# Patient Record
Sex: Male | Born: 1935 | Race: White | Hispanic: No | Marital: Married | Smoking: Former smoker
Health system: Southern US, Community
[De-identification: ages and names within clinical notes are randomized; demographics above are authoritative.]

## PROBLEM LIST (undated history)

## (undated) DIAGNOSIS — D649 Anemia, unspecified: Secondary | ICD-10-CM

## (undated) DIAGNOSIS — I472 Ventricular tachycardia, unspecified: Secondary | ICD-10-CM

## (undated) DIAGNOSIS — I059 Rheumatic mitral valve disease, unspecified: Secondary | ICD-10-CM

## (undated) DIAGNOSIS — K859 Acute pancreatitis without necrosis or infection, unspecified: Secondary | ICD-10-CM

## (undated) DIAGNOSIS — I10 Essential (primary) hypertension: Secondary | ICD-10-CM

## (undated) DIAGNOSIS — I5022 Chronic systolic (congestive) heart failure: Secondary | ICD-10-CM

## (undated) DIAGNOSIS — I2510 Atherosclerotic heart disease of native coronary artery without angina pectoris: Secondary | ICD-10-CM

## (undated) DIAGNOSIS — I252 Old myocardial infarction: Secondary | ICD-10-CM

## (undated) DIAGNOSIS — K219 Gastro-esophageal reflux disease without esophagitis: Secondary | ICD-10-CM

## (undated) DIAGNOSIS — E785 Hyperlipidemia, unspecified: Secondary | ICD-10-CM

## (undated) DIAGNOSIS — I4891 Unspecified atrial fibrillation: Secondary | ICD-10-CM

## (undated) DIAGNOSIS — K649 Unspecified hemorrhoids: Secondary | ICD-10-CM

## (undated) HISTORY — PX: CARDIAC CATHETERIZATION: SHX172

---

## 2008-10-21 ENCOUNTER — Inpatient Hospital Stay: Payer: Self-pay | Admitting: Internal Medicine

## 2009-04-12 ENCOUNTER — Inpatient Hospital Stay: Payer: Self-pay | Admitting: Internal Medicine

## 2010-06-12 ENCOUNTER — Observation Stay: Payer: Self-pay | Admitting: Internal Medicine

## 2011-11-22 ENCOUNTER — Ambulatory Visit: Payer: Self-pay | Admitting: Surgery

## 2012-10-27 ENCOUNTER — Emergency Department: Payer: Self-pay | Admitting: Emergency Medicine

## 2012-10-27 LAB — URINALYSIS, COMPLETE
Bilirubin,UR: NEGATIVE
Blood: NEGATIVE
Ketone: NEGATIVE
Nitrite: NEGATIVE
Ph: 6 (ref 4.5–8.0)
Protein: NEGATIVE
RBC,UR: 1 /HPF (ref 0–5)
Specific Gravity: 1.025 (ref 1.003–1.030)
WBC UR: <1 /HPF (ref 0–5)

## 2012-10-27 LAB — COMPREHENSIVE METABOLIC PANEL
Albumin: 3.4 g/dL (ref 3.4–5.0)
Alkaline Phosphatase: 81 U/L (ref 50–136)
Anion Gap: 8 (ref 7–16)
BUN: 20 mg/dL — ABNORMAL HIGH (ref 7–18)
Calcium, Total: 8.4 mg/dL — ABNORMAL LOW (ref 8.5–10.1)
Chloride: 106 mmol/L (ref 98–107)
SGOT(AST): 21 U/L (ref 15–37)
SGPT (ALT): 23 U/L (ref 12–78)
Total Protein: 6.6 g/dL (ref 6.4–8.2)

## 2012-10-27 LAB — CBC
HCT: 40 % (ref 40.0–52.0)
HGB: 13.4 g/dL (ref 13.0–18.0)
MCV: 99 fL (ref 80–100)
RBC: 4.02 10*6/uL — ABNORMAL LOW (ref 4.40–5.90)
RDW: 12.9 % (ref 11.5–14.5)

## 2012-10-27 LAB — TROPONIN I: Troponin-I: <0.02 ng/mL

## 2012-10-27 LAB — CK TOTAL AND CKMB (NOT AT ARMC)
CK, Total: 76 U/L (ref 35–232)
CK-MB: 1.4 ng/mL (ref 0.5–3.6)

## 2013-11-18 ENCOUNTER — Inpatient Hospital Stay: Payer: Self-pay | Admitting: Internal Medicine

## 2013-11-18 LAB — BASIC METABOLIC PANEL
Anion Gap: 7 (ref 7–16)
BUN: 26 mg/dL — ABNORMAL HIGH (ref 7–18)
CALCIUM: 8.8 mg/dL (ref 8.5–10.1)
CHLORIDE: 104 mmol/L (ref 98–107)
Co2: 28 mmol/L (ref 21–32)
Creatinine: 1.38 mg/dL — ABNORMAL HIGH (ref 0.60–1.30)
EGFR (African American): 56 — ABNORMAL LOW
EGFR (Non-African Amer.): 48 — ABNORMAL LOW
Glucose: 124 mg/dL — ABNORMAL HIGH (ref 65–99)
Osmolality: 284 (ref 275–301)
Potassium: 3.8 mmol/L (ref 3.5–5.1)
Sodium: 139 mmol/L (ref 136–145)

## 2013-11-18 LAB — PRO B NATRIURETIC PEPTIDE: B-Type Natriuretic Peptide: 572 pg/mL — ABNORMAL HIGH (ref 0–450)

## 2013-11-18 LAB — CBC
HCT: 39.3 % — AB (ref 40.0–52.0)
HGB: 13.4 g/dL (ref 13.0–18.0)
MCH: 34 pg (ref 26.0–34.0)
MCHC: 34.1 g/dL (ref 32.0–36.0)
MCV: 100 fL (ref 80–100)
Platelet: 176 10*3/uL (ref 150–440)
RBC: 3.95 10*6/uL — AB (ref 4.40–5.90)
RDW: 12.9 % (ref 11.5–14.5)
WBC: 10.8 10*3/uL — ABNORMAL HIGH (ref 3.8–10.6)

## 2013-11-18 LAB — PROTIME-INR
INR: 1
PROTHROMBIN TIME: 12.8 s (ref 11.5–14.7)

## 2013-11-18 LAB — LIPASE, BLOOD: LIPASE: 3346 U/L — AB (ref 73–393)

## 2013-11-18 LAB — TROPONIN I: Troponin-I: <0.02 ng/mL

## 2013-11-19 LAB — COMPREHENSIVE METABOLIC PANEL
ALK PHOS: 76 U/L
Albumin: 2.8 g/dL — ABNORMAL LOW (ref 3.4–5.0)
Anion Gap: 4 — ABNORMAL LOW (ref 7–16)
BUN: 20 mg/dL — AB (ref 7–18)
Bilirubin,Total: 0.4 mg/dL (ref 0.2–1.0)
CO2: 27 mmol/L (ref 21–32)
Calcium, Total: 7.9 mg/dL — ABNORMAL LOW (ref 8.5–10.1)
Chloride: 108 mmol/L — ABNORMAL HIGH (ref 98–107)
Creatinine: 1.14 mg/dL (ref 0.60–1.30)
EGFR (African American): >60
EGFR (Non-African Amer.): >60
Glucose: 85 mg/dL (ref 65–99)
OSMOLALITY: 279 (ref 275–301)
Potassium: 3.9 mmol/L (ref 3.5–5.1)
SGOT(AST): 29 U/L (ref 15–37)
SGPT (ALT): 28 U/L (ref 12–78)
Sodium: 139 mmol/L (ref 136–145)
TOTAL PROTEIN: 6 g/dL — AB (ref 6.4–8.2)

## 2013-11-19 LAB — URINALYSIS, COMPLETE
BACTERIA: NONE SEEN
BILIRUBIN, UR: NEGATIVE
Blood: NEGATIVE
GLUCOSE, UR: NEGATIVE mg/dL (ref 0–75)
Ketone: NEGATIVE
LEUKOCYTE ESTERASE: NEGATIVE
Nitrite: NEGATIVE
PH: 6 (ref 4.5–8.0)
PROTEIN: NEGATIVE
RBC,UR: 1 /HPF (ref 0–5)
SPECIFIC GRAVITY: 1.045 (ref 1.003–1.030)
Squamous Epithelial: NONE SEEN

## 2013-11-19 LAB — LIPID PANEL
Cholesterol: 113 mg/dL (ref 0–200)
HDL: 41 mg/dL (ref 40–60)
Ldl Cholesterol, Calc: 49 mg/dL (ref 0–100)
Triglycerides: 117 mg/dL (ref 0–200)
VLDL CHOLESTEROL, CALC: 23 mg/dL (ref 5–40)

## 2013-11-19 LAB — MAGNESIUM: Magnesium: 1.7 mg/dL — ABNORMAL LOW

## 2013-11-19 LAB — CBC WITH DIFFERENTIAL/PLATELET
BASOS ABS: 0.1 10*3/uL (ref 0.0–0.1)
Basophil %: 1 %
EOS ABS: 0.1 10*3/uL (ref 0.0–0.7)
Eosinophil %: 1.6 %
HCT: 34.8 % — AB (ref 40.0–52.0)
HGB: 11.9 g/dL — AB (ref 13.0–18.0)
LYMPHS ABS: 0.7 10*3/uL — AB (ref 1.0–3.6)
Lymphocyte %: 12.3 %
MCH: 34.3 pg — ABNORMAL HIGH (ref 26.0–34.0)
MCHC: 34.2 g/dL (ref 32.0–36.0)
MCV: 100 fL (ref 80–100)
MONOS PCT: 11.8 %
Monocyte #: 0.7 x10 3/mm (ref 0.2–1.0)
NEUTROS ABS: 4.1 10*3/uL (ref 1.4–6.5)
Neutrophil %: 73.3 %
PLATELETS: 160 10*3/uL (ref 150–440)
RBC: 3.47 10*6/uL — ABNORMAL LOW (ref 4.40–5.90)
RDW: 13 % (ref 11.5–14.5)
WBC: 5.6 10*3/uL (ref 3.8–10.6)

## 2013-11-19 LAB — CK-MB
CK-MB: 2.1 ng/mL (ref 0.5–3.6)
CK-MB: 1.9 ng/mL (ref 0.5–3.6)
CK-MB: 1.7 ng/mL (ref 0.5–3.6)

## 2013-11-19 LAB — TROPONIN I
Troponin-I: <0.02 ng/mL
Troponin-I: <0.02 ng/mL

## 2013-11-19 LAB — LIPASE, BLOOD: Lipase: 1160 U/L — ABNORMAL HIGH (ref 73–393)

## 2013-11-20 LAB — BASIC METABOLIC PANEL
Anion Gap: 5 — ABNORMAL LOW (ref 7–16)
BUN: 13 mg/dL (ref 7–18)
CHLORIDE: 107 mmol/L (ref 98–107)
CO2: 27 mmol/L (ref 21–32)
CREATININE: 1.19 mg/dL (ref 0.60–1.30)
Calcium, Total: 8.6 mg/dL (ref 8.5–10.1)
EGFR (Non-African Amer.): 57 — ABNORMAL LOW
GLUCOSE: 89 mg/dL (ref 65–99)
Osmolality: 277 (ref 275–301)
Potassium: 4.2 mmol/L (ref 3.5–5.1)
Sodium: 139 mmol/L (ref 136–145)

## 2013-11-20 LAB — CBC WITH DIFFERENTIAL/PLATELET
BASOS ABS: 0.1 10*3/uL (ref 0.0–0.1)
Basophil %: 0.8 %
EOS ABS: 0.2 10*3/uL (ref 0.0–0.7)
EOS PCT: 2.1 %
HCT: 40.3 % (ref 40.0–52.0)
HGB: 13.7 g/dL (ref 13.0–18.0)
Lymphocyte #: 0.7 10*3/uL — ABNORMAL LOW (ref 1.0–3.6)
Lymphocyte %: 9.7 %
MCH: 34.3 pg — ABNORMAL HIGH (ref 26.0–34.0)
MCHC: 34 g/dL (ref 32.0–36.0)
MCV: 101 fL — AB (ref 80–100)
MONO ABS: 0.8 x10 3/mm (ref 0.2–1.0)
Monocyte %: 10.1 %
NEUTROS ABS: 5.9 10*3/uL (ref 1.4–6.5)
Neutrophil %: 77.3 %
Platelet: 170 10*3/uL (ref 150–440)
RBC: 4 10*6/uL — AB (ref 4.40–5.90)
RDW: 13.3 % (ref 11.5–14.5)
WBC: 7.6 10*3/uL (ref 3.8–10.6)

## 2013-11-20 LAB — LIPASE, BLOOD: Lipase: 649 U/L — ABNORMAL HIGH (ref 73–393)

## 2013-12-15 ENCOUNTER — Emergency Department: Payer: Self-pay | Admitting: Emergency Medicine

## 2013-12-15 LAB — CBC
HCT: 41.6 % (ref 40.0–52.0)
HGB: 13.6 g/dL (ref 13.0–18.0)
MCH: 33.1 pg (ref 26.0–34.0)
MCHC: 32.7 g/dL (ref 32.0–36.0)
MCV: 101 fL — ABNORMAL HIGH (ref 80–100)
Platelet: 180 10*3/uL (ref 150–440)
RBC: 4.12 10*6/uL — ABNORMAL LOW (ref 4.40–5.90)
RDW: 12.9 % (ref 11.5–14.5)
WBC: 6.9 10*3/uL (ref 3.8–10.6)

## 2013-12-15 LAB — BASIC METABOLIC PANEL
Anion Gap: 2 — ABNORMAL LOW (ref 7–16)
BUN: 22 mg/dL — ABNORMAL HIGH (ref 7–18)
CHLORIDE: 103 mmol/L (ref 98–107)
Calcium, Total: 8.6 mg/dL (ref 8.5–10.1)
Co2: 31 mmol/L (ref 21–32)
Creatinine: 1.35 mg/dL — ABNORMAL HIGH (ref 0.60–1.30)
GFR CALC AF AMER: 57 — AB
GFR CALC NON AF AMER: 49 — AB
Glucose: 90 mg/dL (ref 65–99)
OSMOLALITY: 275 (ref 275–301)
POTASSIUM: 4.1 mmol/L (ref 3.5–5.1)
SODIUM: 136 mmol/L (ref 136–145)

## 2013-12-15 LAB — TROPONIN I: Troponin-I: <0.02 ng/mL

## 2013-12-15 LAB — LIPASE, BLOOD: LIPASE: 1239 U/L — AB (ref 73–393)

## 2014-03-26 ENCOUNTER — Inpatient Hospital Stay: Payer: Self-pay | Admitting: Internal Medicine

## 2014-03-26 LAB — COMPREHENSIVE METABOLIC PANEL
Albumin: 3.6 g/dL (ref 3.4–5.0)
Alkaline Phosphatase: 74 U/L
Anion Gap: 11 (ref 7–16)
BILIRUBIN TOTAL: 0.7 mg/dL (ref 0.2–1.0)
BUN: 28 mg/dL — ABNORMAL HIGH (ref 7–18)
Calcium, Total: 9 mg/dL (ref 8.5–10.1)
Chloride: 104 mmol/L (ref 98–107)
Co2: 25 mmol/L (ref 21–32)
Creatinine: 1.3 mg/dL (ref 0.60–1.30)
GFR CALC AF AMER: 59 — AB
GFR CALC NON AF AMER: 51 — AB
Glucose: 161 mg/dL — ABNORMAL HIGH (ref 65–99)
Osmolality: 288 (ref 275–301)
Potassium: 4.4 mmol/L (ref 3.5–5.1)
SGOT(AST): 28 U/L (ref 15–37)
SGPT (ALT): 25 U/L (ref 12–78)
Sodium: 140 mmol/L (ref 136–145)
Total Protein: 7.4 g/dL (ref 6.4–8.2)

## 2014-03-26 LAB — LIPASE, BLOOD: LIPASE: 951 U/L — AB (ref 73–393)

## 2014-03-26 LAB — CBC
HCT: 40.8 % (ref 40.0–52.0)
HGB: 13.5 g/dL (ref 13.0–18.0)
MCH: 33.9 pg (ref 26.0–34.0)
MCHC: 33.1 g/dL (ref 32.0–36.0)
MCV: 102 fL — ABNORMAL HIGH (ref 80–100)
PLATELETS: 200 10*3/uL (ref 150–440)
RBC: 3.99 10*6/uL — ABNORMAL LOW (ref 4.40–5.90)
RDW: 12.7 % (ref 11.5–14.5)
WBC: 11.5 10*3/uL — AB (ref 3.8–10.6)

## 2014-03-26 LAB — TROPONIN I
Troponin-I: <0.02 ng/mL
Troponin-I: <0.02 ng/mL

## 2014-03-27 LAB — CBC WITH DIFFERENTIAL/PLATELET
BASOS PCT: 0.7 %
Basophil #: 0 10*3/uL (ref 0.0–0.1)
Eosinophil #: 0.1 10*3/uL (ref 0.0–0.7)
Eosinophil %: 2 %
HCT: 36.8 % — ABNORMAL LOW (ref 40.0–52.0)
HGB: 12.3 g/dL — ABNORMAL LOW (ref 13.0–18.0)
LYMPHS ABS: 1.2 10*3/uL (ref 1.0–3.6)
LYMPHS PCT: 17.3 %
MCH: 34.3 pg — ABNORMAL HIGH (ref 26.0–34.0)
MCHC: 33.5 g/dL (ref 32.0–36.0)
MCV: 102 fL — ABNORMAL HIGH (ref 80–100)
MONO ABS: 0.8 x10 3/mm (ref 0.2–1.0)
Monocyte %: 11.7 %
Neutrophil #: 4.7 10*3/uL (ref 1.4–6.5)
Neutrophil %: 68.3 %
PLATELETS: 163 10*3/uL (ref 150–440)
RBC: 3.6 10*6/uL — ABNORMAL LOW (ref 4.40–5.90)
RDW: 13 % (ref 11.5–14.5)
WBC: 6.9 10*3/uL (ref 3.8–10.6)

## 2014-03-27 LAB — URINALYSIS, COMPLETE
BILIRUBIN, UR: NEGATIVE
BLOOD: NEGATIVE
Glucose,UR: NEGATIVE mg/dL (ref 0–75)
KETONE: NEGATIVE
LEUKOCYTE ESTERASE: NEGATIVE
NITRITE: NEGATIVE
PROTEIN: NEGATIVE
Ph: 8 (ref 4.5–8.0)
RBC,UR: NONE SEEN /HPF (ref 0–5)
SQUAMOUS EPITHELIAL: NONE SEEN
Specific Gravity: 1.009 (ref 1.003–1.030)
WBC UR: 1 /HPF (ref 0–5)

## 2014-03-27 LAB — BASIC METABOLIC PANEL
Anion Gap: 7 (ref 7–16)
BUN: 20 mg/dL — AB (ref 7–18)
CREATININE: 1.16 mg/dL (ref 0.60–1.30)
Calcium, Total: 8.2 mg/dL — ABNORMAL LOW (ref 8.5–10.1)
Chloride: 107 mmol/L (ref 98–107)
Co2: 29 mmol/L (ref 21–32)
EGFR (African American): >60
EGFR (Non-African Amer.): 59 — ABNORMAL LOW
GLUCOSE: 94 mg/dL (ref 65–99)
Osmolality: 287 (ref 275–301)
Potassium: 4.1 mmol/L (ref 3.5–5.1)
SODIUM: 143 mmol/L (ref 136–145)

## 2014-04-23 ENCOUNTER — Ambulatory Visit: Payer: Self-pay | Admitting: Internal Medicine

## 2014-07-08 ENCOUNTER — Ambulatory Visit: Payer: Self-pay | Admitting: Internal Medicine

## 2014-07-09 ENCOUNTER — Ambulatory Visit: Payer: Self-pay | Admitting: Surgery

## 2014-07-16 ENCOUNTER — Ambulatory Visit: Payer: Self-pay | Admitting: Surgery

## 2014-08-10 ENCOUNTER — Emergency Department: Payer: Self-pay | Admitting: Emergency Medicine

## 2014-08-10 LAB — CBC WITH DIFFERENTIAL/PLATELET
Basophil #: 0.1 10*3/uL (ref 0.0–0.1)
Basophil %: 1.6 %
EOS ABS: 0.2 10*3/uL (ref 0.0–0.7)
Eosinophil %: 2.1 %
HCT: 41.8 % (ref 40.0–52.0)
HGB: 13.8 g/dL (ref 13.0–18.0)
Lymphocyte #: 1.2 10*3/uL (ref 1.0–3.6)
Lymphocyte %: 15.1 %
MCH: 33.1 pg (ref 26.0–34.0)
MCHC: 32.9 g/dL (ref 32.0–36.0)
MCV: 101 fL — AB (ref 80–100)
Monocyte #: 0.6 x10 3/mm (ref 0.2–1.0)
Monocyte %: 7.4 %
NEUTROS ABS: 6.1 10*3/uL (ref 1.4–6.5)
Neutrophil %: 73.8 %
Platelet: 184 10*3/uL (ref 150–440)
RBC: 4.16 10*6/uL — AB (ref 4.40–5.90)
RDW: 12.6 % (ref 11.5–14.5)
WBC: 8.2 10*3/uL (ref 3.8–10.6)

## 2014-08-10 LAB — COMPREHENSIVE METABOLIC PANEL
Albumin: 3.3 g/dL — ABNORMAL LOW (ref 3.4–5.0)
Alkaline Phosphatase: 75 U/L
Anion Gap: 7 (ref 7–16)
BUN: 20 mg/dL — AB (ref 7–18)
Bilirubin,Total: 0.5 mg/dL (ref 0.2–1.0)
CALCIUM: 8.5 mg/dL (ref 8.5–10.1)
Chloride: 108 mmol/L — ABNORMAL HIGH (ref 98–107)
Co2: 28 mmol/L (ref 21–32)
Creatinine: 1.17 mg/dL (ref 0.60–1.30)
EGFR (Non-African Amer.): >60
Glucose: 130 mg/dL — ABNORMAL HIGH (ref 65–99)
Osmolality: 289 (ref 275–301)
POTASSIUM: 4.3 mmol/L (ref 3.5–5.1)
SGOT(AST): 30 U/L (ref 15–37)
SGPT (ALT): 27 U/L
Sodium: 143 mmol/L (ref 136–145)
Total Protein: 6.9 g/dL (ref 6.4–8.2)

## 2014-08-10 LAB — LIPASE, BLOOD: Lipase: 765 U/L — ABNORMAL HIGH (ref 73–393)

## 2014-08-10 LAB — TROPONIN I: Troponin-I: <0.02 ng/mL

## 2015-01-09 NOTE — Consult Note (Signed)
Chief Complaint:  Subjective/Chief Complaint Please see full GI consult and brief consult note.  Paitent 81 yo m admitted with acute pancreatitis, epigastric pain and nausea.  feeling much better currently.  CT concerning of dilated P-duct and possible lesion in the heqad of the pancreas. Currently no abdominalpain.  Will awaiti mrcp tomorrow, will discuss with Dr Earlie Counts possible EUS tomorrow.  Further recs to follow.   VITAL SIGNS/ANCILLARY NOTES: **Vital Signs.:   04-Mar-15 14:05  Celsius 36.8  Temperature Source oral  Pulse Pulse 66  Respirations Respirations 20  Systolic BP Systolic BP 283  Diastolic BP (mmHg) Diastolic BP (mmHg) 72  Mean BP 93  Pulse Ox % Pulse Ox % 93  Pulse Ox Activity Level  At rest  Oxygen Delivery Room Air/ 21 %   Brief Assessment:  Cardiac Regular   Respiratory clear BS   Gastrointestinal details normal Soft  Nontender  Nondistended  No masses palpable  Bowel sounds normal   Lab Results: Hepatic:  04-Mar-15 06:08   Bilirubin, Total 0.4  Alkaline Phosphatase 76 (45-117 NOTE: New Reference Range 08/08/13)  SGPT (ALT) 28  SGOT (AST) 29  Total Protein, Serum  6.0  Albumin, Serum  2.8  Routine Chem:  03-Mar-15 20:23   Lipase  3346 (Result(s) reported on 18 Nov 2013 at 09:14PM.)  04-Mar-15 06:08   Cholesterol, Serum 113  Triglycerides, Serum 117  HDL (INHOUSE) 41  VLDL Cholesterol Calculated 23  LDL Cholesterol Calculated 49 (Result(s) reported on 19 Nov 2013 at 04:02PM.)  Lipase  1160 (Result(s) reported on 19 Nov 2013 at 09:16AM.)  Glucose, Serum 85  BUN  20  Creatinine (comp) 1.14  Sodium, Serum 139  Potassium, Serum 3.9  Chloride, Serum  108  CO2, Serum 27  Calcium (Total), Serum  7.9  Osmolality (calc) 279  eGFR (African American) >60  eGFR (Non-African American) >60 (eGFR values <21m/min/1.73 m2 may be an indication of chronic kidney disease (CKD). Calculated eGFR is useful in patients with stable renal function. The eGFR  calculation will not be reliable in acutely ill patients when serum creatinine is changing rapidly. It is not useful in  patients on dialysis. The eGFR calculation may not be applicable to patients at the low and high extremes of body sizes, pregnant women, and vegetarians.)  Anion Gap  4  Magnesium, Serum  1.7 (1.8-2.4 THERAPEUTIC RANGE: 4-7 mg/dL TOXIC: > 10 mg/dL  -----------------------)  Cardiac:  04-Mar-15 02:06   CPK-MB, Serum 1.9 (Result(s) reported on 19 Nov 2013 at 02:58AM.)  Troponin I < 0.02 (0.00-0.05 0.05 ng/mL or less: NEGATIVE  Repeat testing in 3-6 hrs  if clinically indicated. >0.05 ng/mL: POTENTIAL  MYOCARDIAL INJURY. Repeat  testing in 3-6 hrs if  clinically indicated. NOTE: An increase or decrease  of 30% or more on serial  testing suggests a  clinically important change)    06:08   CPK-MB, Serum 1.7 (Result(s) reported on 19 Nov 2013 at 06:41AM.)  Troponin I < 0.02 (0.00-0.05 0.05 ng/mL or less: NEGATIVE  Repeat testing in 3-6 hrs  if clinically indicated. >0.05 ng/mL: POTENTIAL  MYOCARDIAL INJURY. Repeat  testing in 3-6 hrs if  clinically indicated. NOTE: An increase or decrease  of 30% or more on serial  testing suggests a  clinically important change)  Routine Hem:  04-Mar-15 06:08   WBC (CBC) 5.6  RBC (CBC)  3.47  Hemoglobin (CBC)  11.9  Hematocrit (CBC)  34.8  Platelet Count (CBC) 160  MCV 100  MCH  34.3  MCHC 34.2  RDW 13.0  Neutrophil % 73.3  Lymphocyte % 12.3  Monocyte % 11.8  Eosinophil % 1.6  Basophil % 1.0  Neutrophil # 4.1  Lymphocyte #  0.7  Monocyte # 0.7  Eosinophil # 0.1  Basophil # 0.1 (Result(s) reported on 19 Nov 2013 at 06:38AM.)   Radiology Results: CT:    03-Mar-15 22:34, CT Abdomen and Pelvis With Contrast  CT Abdomen and Pelvis With Contrast   REASON FOR EXAM:    (1) epigastric pain, tenderness; (2) epigastric pain,   tenderness  COMMENTS:   May transport without cardiac monitor    PROCEDURE: CT  - CT  ABDOMEN / PELVIS  W  - Nov 18 2013 10:34PM     CLINICAL DATA:  Chest pain.  Epigastric pain and tenderness.    EXAM:  CT ABDOMEN AND PELVIS WITH CONTRAST    TECHNIQUE:  Multidetector CT imaging of the abdomen and pelvis was performed  using the standard protocol following bolus administration of  intravenous contrast.  CONTRAST:  100 cc Isovue 370.    COMPARISON:  None.    FINDINGS:  Scarring/atelectasis lung bases.    Gas is secretion filled stomach without point of obstruction  identified.    Prominent sigmoid and descending colon diverticula without extra  luminal bowel inflammatory process, free fluid or free air.    Prominent pancreatic duct. No obstructing mass identified. In the  pancreatic head/ uncinate process region there is a 1.1 cm  low-density structure. This may be an incidental finding. Low grade  intra papillary mucinous tumor not excluded. Stability can be  confirmed on follow-up in 6 months.    1.1 cm calcified splenic artery aneurysm.    Prominent coronary artery calcifications.  Heart size top-normal.    Atherosclerotic type changes of the aorta with mild ectasia without  aneurysmal dilation. Calcified plaque common iliac arteries with  mild to moderate narrowing.    Mild intrahepatic biliary duct prominence felt to be within range of  normal limits given the patient's age.  Radiopaque gravel within the dependent portion of the gallbladder  without CT evidence of inflammation. If this is of concern  ultrasound recommended for further delineation.    No worrisome splenic, renal or left adrenal lesion. Adenoma right  adrenal gland suspected. Parapelvic renal cysts larger on the left.    Degenerative changes lumbar spine and hip joints. Spinal stenosis  most prominent mid lumbar region.    Non contrast filled views of the urinary bladder unremarkable.  Slightly lobulated contour of the prostate gland. Clinical and  laboratory correlation  recommended.    Fatty containing left inguinal hernia.   IMPRESSION:  Gas is secretion filled stomach without point of obstruction  identified.    Prominent sigmoid and descending colon diverticula without extra  luminal bowel inflammatory process, free fluid or free air.    Prominent pancreatic duct. No obstructing mass identified. In the  pancreatic head/ uncinate process region there is a 1.1 cm  low-density structure. This may be an incidental finding.Low grade  intra papillary mucinous tumor not excluded. Stability can be  confirmed on follow-up in 6 months.    1.1 cm calcified splenic artery aneurysm.  Prominent coronary artery calcifications.    Atherosclerotic type changes of the aorta and common iliac arteries.    Mild intrahepatic biliary duct prominence felt to be within range of  normal limits given the patient's age.    Radiopaque gravel within the dependent  portion of the gallbladder  without CT evidence of inflammation. If this is of concern  ultrasound recommended for further delineation.    Adenoma right adrenal gland suspected.    Slightly lobulated contour of the prostate gland. Clinical and  laboratory correlation recommended.  Electronically Signed    By: Chauncey Cruel M.D.    On: 11/18/2013 23:03         Verified By: Doug Sou, M.D.,   Electronic Signatures: Loistine Simas (MD)  (Signed 04-Mar-15 16:29)  Authored: Chief Complaint, VITAL SIGNS/ANCILLARY NOTES, Brief Assessment, Lab Results, Radiology Results   Last Updated: 04-Mar-15 16:29 by Loistine Simas (MD)

## 2015-01-09 NOTE — H&P (Signed)
PATIENT NAME:  [name], Shiraz L MR#:  [id number] DATE OF BIRTH:  1930-12-27  DATE OF ADMISSION:  03/26/2014  PRIMARY DOCTOR:  Dr. Marcello FennelHande  EMERGENCY ROOM PHYSICIAN:  Dr. Margarita GrizzleWoodruff.   CHIEF COMPLAINT: Abdominal pain and chest pain.   HISTORY OF PRESENT ILLNESS:  An 81 year old male patient, discharged from hospital in March for acute pancreatitis.  Comes back today because of abdominal pain and chest pain. The patient was alone in his yard and became diaphoretic and weak.  At that time, his blood pressure was 78/48. The patient was very diaphoretic and also the patient blood pressure was low.  Patient complains of epigastric pain since morning to around 1:30 p.m. today. Denies any nausea and vomiting, and the patient thought it was gas pain and took some Mylanta. The patient denies any diarrhea and denies any chest pain or trouble breathing. The patient was seen and admitted in March.  At that time, he was here from March 3 to March 5.  He had a lot of tests for pancreatitis. The patient had MRCP and patient was seen by Dr. Marva PandaSkulskie.  Patient's MRCP showed chronic pancreatitis, and the patient had a questionable cystic focus, which is 1 cm within the pancreatic head and uncinate process junction. The patient did not have tumor marker at that time because of acute pancreatitis. The patient is supposed to have an endoscopic ultrasound.  That is the only test that is pending at this time to evaluate for pancreatitis. The patient saw Dr. Marva PandaSkulskie yesterday. According to him, everything was normal, and the patient is scheduled to follow with MRCP in September.  In the ER, the Dr. Marva PandaSkulskie was contacted by Dr. Margarita GrizzleWoodruff and he suggested medical management and admission to medicine.  The patient's family is requesting further testing in terms of EUS or some other testing for his pancreatitis. The patient's other labs are non-significant. Troponins were also negative at this time.   PAST MEDICAL HISTORY: Significant  for history of history of fibrillation and also history of coronary artery disease.  When he was admitted in March, his lipase was 3246. The patient also had a history of ventricular tachycardia and he was on amiodarone.   ALLERGIES: NO KNOWN ALLERGIES.   MEDICATIONS:  1.  Imdur 30 mg p.o. daily. 2.  Aspirin 81 mg daily. 3.  Lisinopril 10 mg p.o. daily. 4.  (  5.  According to him, he is not taking amiodarone and Dr. Gwen PoundsKowalski stopped it 2 months ago.   FAMILY HISTORY: No family history of coronary artery disease.   SOCIAL HISTORY: No smoking, no drinking, previous smoker.   PAST SURGICAL HISTORY: Significant for knee repair.     REVIEW OF SYSTEMS: CONSTITUTIONAL: He feels good.  He wants to go home. Denies any fever or fatigue.  EYES: No blurred vision.  EARS, NOSE, AND THROAT: No tinnitus. No epistaxis. No difficulty swallowing.  RESPIRATIONS: No cough. No wheezing.  CARDIOVASCULAR: No chest pain, orthopnea, or PND. GASTROINTESTINAL:  Mild epigastric pain present. No nausea. No vomiting. No other problems. No constipation.  GENITOURINARY: No dysuria.  ENDOCRINE: No polyuria or nocturia.  HEMATOLOGIC: No anemia.  INTEGUMENTARY: No skin rashes.  MUSCULOSKELETAL: No joint pain. NEUROLOGIC: No numbness or weakness.  PSYCHOLOGIC:  No injection or insomnia.   PHYSICAL EXAMINATION: GENERAL:   This is an 81 year old male, well-developed, well-nourished, not in distress at this time. VITAL SIGNS:  Temperature is 97.5, heart rate 75, blood pressure 128/98, saturations 100% on room air.  GENERAL: The patient was hypotensive at home, blood pressure was 78/48.  EYES: PERRLA.  EOM intact. No scleral icterus. HEAD:  Normocephalic, atraumatic.   NOSE: No lesions.  No drainage. EARS: No drainage. No lesions.  MOUTH: No lesions. No exudates.  NECK: Supple. No JVD. No carotid bruit.  No quickly range of motion.  RESPIRATORY: Good respiratory effort. Clear to auscultation. No wheeze or rales.   CARDIOVASCULAR: S1, S2 regular. No murmurs. No gallops.  PMI not displaced. GASTROINTESTINAL: Mild epigastric pain present.  No rebound tenderness.  No hernias. Bowel sounds are present in all quadrants.  MUSCULOSKELETAL: Normal gait and station.  EXTREMITIES: Moves x 4. No tenderness ,effusion.  SKIN: Inspection is normal.  LYMPHATICS: No lymphadenopathy.   VASCULAR: Good pedal pulses.  NEUROLOGIC: Alert and oriented.  Cranial nerves II-XII intact.  Power 5/5 upper and lower extremities are intact.  DTRs are intact bilaterally.   The patient's chest x-ray is> without any acute cardiopulmonary abnormality.    WBC 7.3, hemoglobin 13.5, hematocrit 30.8, platelets 200, lipase 950.   ELECTROLYTES: Sodium 140, potassium 4.4, chloride 104, bicarbonate 25, BUN 28, creatinine 1.3 and serum glucose 151.   Patient's troponin less than 0.02.   EKG: Normal sinus rhythm with some PVCs at 62 beats per minutes. No ST-T changes.   ASSESSMENT AND PLAN:  1.  The patient is an 81 year old male patient with sudden onset of dizziness and hypotension probably secondary to dehydration other than any coronary event.  EKG and troponins are negative, but continue to cycle troponins and watch him closely.   2.  Mild acute pancreatitis. The patient has acute on chronic pancreatitis at this time.  Had a lot of testing done  including MRCP.  Again, he was admitted in March. We will not do further testing. Check lipase and continue fluids and also obtain GI consult with Dr. Marva Panda to evaluate further need for endoscopic ultrasound at this time.   3.  Chronic atrial fibrillation, not on anticoagulation.  He was on amiodarone drip, but that was stopped by Dr. Gwen Pounds. The patient not taking that.  4.  History of coronary artery disease.  No chest pain at this time. Continue Imdur and lisinopril and monitor closely.  5.  Gastrointestinal prophylaxis, proton pump inhibitors.   TIME SPENT ON HISTORY AND PHYSICAL: 60,  minutes.   We will transfer to Dr. Marcello Fennel.    ____________________________ Katha Hamming, MD sk:ts D: 03/26/2014 16:06:23 ET T: 03/26/2014 17:22:52 ET JOB#: [id number]  cc: Katha Hamming, MD, <Dictator> Katha Hamming MD ELECTRONICALLY SIGNED 04/02/2014 20:14

## 2015-01-09 NOTE — Consult Note (Signed)
PATIENT NAME:  [name], [name] DATE OF BIRTH:  [birth year]  DATE OF ADMISSION: 11/18/2013 DATE OF CONSULTATION:  11/19/2013  REFERRING PHYSICIAN:  Dr. Randol KernElgergawy.  CONSULTING PHYSICIAN:  Keturah Barrehristiane H. London, NP  REASON FOR CONSULTATION: To evaluate for acute pancreatitis with pancreatic growth.   HISTORY OF PRESENT ILLNESS: I appreciate consult for an 81 year old Caucasian man admitted with pancreatitis for evaluation of same and pancreatic head mass. States no history of prior pancreatitis, no new medications but did start probiotics recently. Has been on pravastatin for hyperlipidemia. Denies alcohol intake and family history of pancreatic disease. No apparent biliary disease. No history of diabetes mellitus. States onset of significant epigastric pain and burping approximately 2 hours after eating a peanut butter and banana sandwich about 36 hours ago. Thought at first he was having MI, was given GI cocktail, IV pain meds, and started on fluid resuscitation when found to have pancreatitis. He denies pain and further GI complaints presently. Lipase improved from yesterday. Do note 1.1 cm low-density lesion in the pancreatic head. IPMT is in the differential.   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   FAMILY HISTORY: No pancreatic disease, colon cancer, rectal cancer, liver disease or ulcers.   SOCIAL HISTORY: Lives at home with wife. No alcohol or tobacco. Exercises 4 times a week in the gym. Ex-smoker. No illicits.   ALLERGIES: LIPITOR.   HOME MEDICATIONS: ASA 81 mg daily, isosorbide mononitrate 15 mg daily, pravastatin 20 mg p.o. at bedtime, omeprazole 20 mg p.o. daily, vitamin E 400 units daily, lisinopril 10 mg p.o. daily, amiodarone 200 mg p.o. daily. Denies over-the-counter medications and herbals  with the exception of a probiotic he just started.   REVIEW OF SYSTEMS: Ten systems reviewed, unremarkable other than what is noted above.   MOST RECENT LABORATORIES: Glucose 85. BNP 572. BUN 20, creatinine 1.14, sodium 139, potassium 3.9, chloride 108, GFR greater than 60, calcium 7.9, Mg 1.7, lipase 1160, albumin 2.8, total protein 6, total bilirubin 0.4, ALP 76, AST 29, ALT 28. CPK-MB, troponins have been negative x3. WBC 5.6, hemoglobin 11.9, hematocrit 34.8, platelet count 160, red cells normocytic. PT-INR 12.81.   CT done yesterday with prominent pancreatic duct: There is a pancreatic head mass, 1.1 cm low-density structure. Cannot exclude interscapular mucinous tumor. Mild intrahepatic ductal prominence felt to be within range of normal limits given the patient's age. Questionable gravel in the dependent portion of the gallbladder without any signs of inflammation. Suspected right adrenal adenoma. Somewhat irregular appearance of the prostate gland.   PHYSICAL EXAMINATION:  VITAL SIGNS: Most recent: Temperature 97.9, pulse 65, respiratory rate 20, blood pressure 115/64, SaO2 94% on room air.  GENERAL: A well-appearing man resting comfortably in bed in no acute distress.  HEENT: Normocephalic, atraumatic. Sclerae are clear. No icterus.  NECK: Supple. No thyromegaly or lymphadenopathy.  CHEST: Respirations eupneic. Lungs clear.  CARDIAC: S1, S2, RRR. No MRG.  ABDOMEN: Flat, soft, nontender, nondistended. Bowel sounds x4. No guarding, rigidity, peritoneal signs, masses or other abnormalities noted.  EXTREMITIES: No clubbing. No cyanosis. Strength five out of five. Moves all extremities well.  SKIN: Warm, dry, pink. No erythema, lesion or rash.  PSYCHIATRIC: Pleasant, calm, cooperative, intact judgment and insight.  NEUROLOGICAL: Alert, oriented x3. Cranial nerves II through XII, intact. Speech clear. No facial droop.   IMPRESSION AND PLAN: Acute pancreatitis associated with pancreatic mass.  Agree with fluid resuscitation and pain control. As he is feeling better, we will advance him to clear liquid diet. Will add lipid panel to his admission blood work, schedule magnetic resonance cholangiopancreatography. As there is some pancreatic ductal dilation, likely will require endoscopic ultrasound as clinically feasible, holding tumor markers for now as current pancreatic inflammation may cause false elevation.   Regarding his prostate, defer to admitting team.   These services were provided by Vevelyn Pat, MSN, [facility], in collaboration with Barnetta Chapel, M.D.,  with whom I have discussed this patient in full.   Thank you very much for this consult.    ____________________________ Keturah Barre, NP chl:np D: 11/19/2013 15:36:48 ET T: 11/19/2013 16:13:59 ET JOB#: [id number]  cc: Keturah Barre, NP, <Dictator> Eustaquio Maize LONDON FNP ELECTRONICALLY SIGNED 12/17/2013 10:50

## 2015-01-09 NOTE — Op Note (Signed)
PATIENT NAME:  [name], [name] MR#:  [id number] DATE OF BIRTH:  11-01-1930  DATE OF PROCEDURE:  07/16/2014  PREOPERATIVE DIAGNOSIS: Recurrent left inguinal hernia.   POSTOPERATIVE DIAGNOSIS: Recurrent left inguinal hernia.   PROCEDURE: Left inguinal hernia repair.   SURGEON: Renda RollsWilton Smith, MD   ANESTHESIA: General.   INDICATIONS: This 81 year old male has had bilateral inguinal hernia repair 2 years ago and recently has had recurrent bulging in the left groin. A left inguinal hernia was demonstrated on physical examination and repair was recommended for definitive treatment.   DESCRIPTION OF PROCEDURE: The patient was placed on the operating table in the supine position under general anesthesia. The left lower quadrant was clipped and prepared with ChloraPrep, draped in a sterile manner.   A left lower quadrant transversely-oriented suprapubic incision was made at the site of an old scar and carried down through subcutaneous tissues. Several small bleeding points were cauterized. Scarpa fascia was incised. The external oblique aponeurosis was incised along the course of its fibers. There was scar tissue appreciated during the course of the dissection. There was a finding of an indirect hernia sac, which was dissected free from surrounding structures and was approximately 7 cm in length. It was separated from the cord structures. The cord structures were mobilized and retracted laterally with a Penrose drain. The sac was dissected up into the internal ring. It appeared to be a sliding type hernia with sigmoid colon. The sac was inverted. Next, the fascial ring defect was fairly thick and it appeared to be strong enough to hold sutures. The repair was carried out with a row of 0 Surgilon sutures, suturing the fascia at the edges of the ring defect and leaving a small opening for the cord structures. The repair looked good. Hemostasis was intact. Next, the cut edges of the external oblique aponeurosis  were closed with a running 4-0 Vicryl. The deep fascia superior and lateral to the repair site was infiltrated with 0.5% Sensorcaine with epinephrine. The subcutaneous tissues were infiltrated as well. The Scarpa fascia was closed with interrupted 4-0 Vicryl. The skin was closed with running 4-0 Monocryl subcuticular suture and Dermabond. The patient tolerated surgery satisfactorily and was then prepared for transfer to the recovery room.   ____________________________ Shela CommonsJ. Renda RollsWilton Smith, MD jws:TT D: 07/16/2014 08:33:33 ET T: 07/16/2014 19:20:07 ET JOB#: [id number]  cc: Adella HareJ. Wilton Smith, MD, <Dictator> Adella HareWILTON J SMITH MD ELECTRONICALLY SIGNED 07/17/2014 19:22

## 2015-01-09 NOTE — H&P (Signed)
PATIENT NAME:  [name], [name] MR#:  [id number] DATE OF BIRTH:  11-14-1930  DATE OF ADMISSION:  11/18/2013  REFERRING PHYSICIAN:  Dr. Sharman Cheek.  PRIMARY CARE PHYSICIAN:  Dr. Barbette Reichmann.   CHIEF COMPLAINT:  This is an 81 year old with significant past medical history of coronary artery disease, atrial fibrillation on aspirin who presents with complaints of epigastric pain, the patient reports at 7:00 p.m. he started to have some dull epigastric pain, was progressing with intensity.  He could not tolerate, so was nonradiating, as well had complaint by some mild nausea, reports he took some sublingual nitro at home with no relief, when it progressed in intensity he called EMS, the patient reports in EMS he did burp twice.  He was given some GI cocktail which helped with his pain, currently he denies any pain, the patient's first troponin was negative.  EKG did not show any significant changes once compared to one in [facility] in February of last year, the patient was found to have elevated lipase level at 3346, the patient had CT abdomen and pelvis with contrast which did show evidence of moderately dilated pancreatic duct without any obstructing mass, as well there was a structure of 1.1 cm was found in the pancreatic head/uncinate process, hospitalist service were requested to admit the patient for acute pancreatitis and further evaluation for his epigastric pain, the patient denies any alcohol use, any previous history of pancreatitis in the past, the patient reports he has been having problems with indigestion of the last few weeks, as well, he reports he has change in consistency of his stool where it is more pasty currently, but denies any melena, any coffee-ground emesis, any bright red blood in stools, any fever, any chills, any weight loss, any diarrhea.   PAST MEDICAL HISTORY: 1.  Coronary artery disease with known three vessel critical coronary atherosclerosis by cardiac cath  in 2010.  2.  Moderate LV dysfunction from previous MI.  3.  Hyperlipidemia.  4.  Hypertension.  5.  Ventricular tachycardia.  6.  Atrial fibrillation.   FAMILY HISTORY:  No family members with early onset of cardiovascular disease or hypertension.   SOCIAL HISTORY:  The patient denies any smoking, any drinking.  He is an ex-smoker.   ALLERGIES:  LIPITOR.   HOME MEDICATIONS: 1.  Aspirin 81 mg daily.  2.  Isosorbide mononitrate 15 mg oral daily.  3.  Pravastatin 20 mg oral at bedtime.  4.  Omeprazole 20 mg oral daily.  5.  Vitamin E 400 1 capsule oral daily.  6.  Lisinopril 10 mg oral daily.  7.  Amiodarone 200 mg oral daily.   REVIEW OF SYSTEMS:  CONSTITUTIONAL:  Denies fever, chills, fatigue, weakness, weight gain, weight loss. EYES:  Denies blurry vision, double vision, inflammation, glaucoma. EARS, NOSE, THROAT:  Denies tinnitus, ear pain, hearing loss, epistaxis or discharge. RESPIRATORY:  Denies cough, wheezing, hemoptysis, dyspnea, asthma. CARDIOVASCULAR:  Denies chest pain, edema, arrhythmia, palpitations. GASTROINTESTINAL:  Reports nausea and epigastric pain, currently resolved.  Denies any vomiting, diarrhea, constipation, hematemesis, melena, rectal bleed. GENITOURINARY:  Denies dysuria, hematuria, renal colic.  ENDOCRINE:  Denies polyuria, polydipsia, heat or cold intolerance.  HEMATOLOGY:  Denies anemia, easy bruising, bleeding diathesis.  INTEGUMENTARY:  Denies acne, rash or skin lesions. MUSCULOSKELETAL:  Denies any swelling, gout, arthritis, cramps.   NEUROLOGIC:  Denies CVA, TIA, ataxia, vertigo, tremor.   PSYCHIATRIC:  Denies anxiety, insomnia or depression.   PHYSICAL EXAMINATION: VITAL SIGNS:  Temperature 97.5,  pulse 66, respiratory rate 18, blood pressure 118/72, saturating 96% on room air.  GENERAL:  Well-nourished male looks comfortable in bed in no apparent distress. HEENT:  Head atraumatic, normocephalic.  Pupils equal, reactive to light.  Pink  conjunctivae.  Anicteric sclerae.  Moist oral mucosa.  NECK:  Supple.  No thyromegaly.  No JVD.  CHEST:  Good air entry bilaterally.  No wheezes, rales, rhonchi.  CARDIOVASCULAR:  S1 and S2 heard.  No rubs, murmur or gallops, irregular irregular.  ABDOMEN:  Soft, nontender, nondistended.  Bowel sounds present.  EXTREMITIES:  No edema.  No clubbing.  No cyanosis.  Pedal and tibial pulses +2 bilaterally.  PSYCHIATRIC:  Pleasant, awake, alert x 3.  Intact judgment and insight.  Cranial nerves grossly intact.  Strength 5 out of 5.  No focal deficits.  SKIN:  Normal skin turgor.  Warm and dry.  MUSCULOSKELETAL:  No joint effusion or erythema.   PERTINENT LABORATORY DATA:  Glucose 124, BNP 572, BUN 26, creatinine 1.38, sodium 139, potassium 3.8, chloride 104, CO2 28.  Troponin less than 0.02.  Lipase 3346.  White blood cells 10.8, hemoglobin 13.4, hematocrit 39.3, platelets 176.  Urinalysis negative for leukocyte esterase and nitrite.  Lactic acid 1.7.   IMAGING STUDIES:  CT abdomen and pelvis with IV contrast showing prominent pancreatic duct, no obstructing mass is identified in the pancreatic head and uncinate process region.  There is a 1.1 cm low density structure, incidental finding low-grade  mucinous tumor not excluded.   ASSESSMENT AND PLAN: 1.  Epigastric pain with elevated lipase, the patient has acute pancreatitis, as well there is a presence of pancreatic mass, the patient will be admitted to the hospital, will be kept nothing by mouth, will be kept on as needed nausea and pain medicine, will be kept on aggressive IV fluid hydration, we will consult gastroenterology for help and the input of his pancreatic mass, we will advance diet as per tolerated.  2.  Atypical chest pain, it appears to be most likely epigastric pain, but given the patient's significant history, we will have him on off unit telemetry.  We will continue to cycle his cardiac enzymes and follow the trend.  We will give him 324  mg of aspirin.  3.  Acute renal failure.  This is most likely due to dehydration.  We will continue with aggressive IV hydration.  We will hold lisinopril.  4.  Known history of atrial fibrillation, we will continue the patient on amiodarone and aspirin.  5.  Hyperlipidemia.  Continue with statin.  6.  Known history of mild systolic congestive heart failure, currently appears to be compensated.  We will continue with aggressive IV fluids  due to his pancreatitis.  We will keep close eye not to put the patient on volume overload.  7.  Deep vein thrombosis prophylaxis.  SubQ heparin.  8.  Gastrointestinal prophylaxis, on proton pump inhibitor.  9.  CODE STATUS:  Discussed with the patient, reports he does not have a LIVING WILL.  His oldest son Henreitta CeaGarry, he is his healthcare power of attorney and reports he is a FULL CODE.   Total time spent on admission and patient care 55 minutes.    ____________________________ Starleen Armsawood S. Breeann Reposa, MD dse:ea D: 11/19/2013 00:38:09 ET T: 11/19/2013 04:45:14 ET JOB#: [id number]  cc: Starleen Armsawood S. Nakisha Chai, MD, <Dictator> Liane Tribbey Teena IraniS Jakell Trusty MD ELECTRONICALLY SIGNED 11/20/2013 9:05

## 2015-01-09 NOTE — Consult Note (Signed)
Brief Consult Note: Diagnosis: abdominal pain/pancreatitis.   Patient was seen by consultant.   Consult note dictated.   Comments: Appreciate consult for 81 y/o caucasian man admitted with pancreatitis for evaluation of same and pancreatic head mass. States no history of prior pancreatitis. No new medications but did start probiotics recently. Has been on Pravastatin for HL. Denies etoh and family history of pancreatic disease. No apparent biliary disease. No history of DM.  States onset of significant epigastric pain and burping approximately 2h after eating peanut butter/banana sandwich about 36h ago Thought at first he was having MI. Was given GI cocktail, IV pain meds, and started on fluid resuscitation when found to have pancreatitis. Denies pain and further GI complaints presently. Lipase improved from yesterday. Do note 1.1cm low density lesion in the pancreatic head, IPMT in the differential.  Impression and plan: acute panreatitis, pancreatic mass. Agree with fluid reuscitation, pain control. As he is feeling better, will advance him to clears. Will add lipid panel to admission bloodwork. Schedule MRCP as there is some pancreatic duct dilation. Likely will require EUS as clinically feasible. Holding tumor markers for now, as current pancreatic inflammation may cause false elevation.  Electronic Signatures: Vevelyn PatLondon, Tamarick Kovalcik H (NP)  (Signed 04-Mar-15 15:27)  Authored: Brief Consult Note   Last Updated: 04-Mar-15 15:27 by Keturah BarreLondon, Mita Vallo H (NP)

## 2015-01-09 NOTE — Discharge Summary (Signed)
PATIENT NAME:  [name], [name] DATE OF BIRTH:  Feb 18, 1931  DATE OF ADMISSION:  11/18/2013 DATE OF DISCHARGE:  11/20/2013   DIAGNOSES AT THE TIME OF DISCHARGE:  1. Acute pancreatitis.  2. History of coronary artery disease.  3. History of atrial fibrillation.   CHIEF COMPLAINT: Abdominal pain.   HISTORY OF PRESENT ILLNESS: [name] is an 81 year old male with a history of CAD, atrial fibrillation, on aspirin, who presents to the ED complaining of epigastric pain. [name] states it was initially dull and subsequently progressed. [name] also complained of some mild nausea. [name] was given a GI cocktail in the ED, which did help to some extent. The patient was noted to have an elevated lipase of 3346 and admitted for acute pancreatitis.   PAST MEDICAL HISTORY: Significant for CAD, moderate LV dysfunction from previous MI, history of hyperlipidemia, hypertension, ventricular tachycardia, atrial fibrillation.   PHYSICAL EXAMINATION:  VITAL SIGNS: Temperature was 97.5, respirations 18, blood pressure 118/72, O2 saturation 96% on room air.  GENERAL: [name] was not in distress.  HEENT: NCAT.  NECK: Supple. No JVD.  HEART: S1, S2.  LUNGS: Clear.  ABDOMEN: Soft, nontender.  EXTREMITIES: No edema.   LABORATORY DATA: Glucose 124. BNP 572. BUN 26, creatinine 1.38, sodium 139, potassium 3.8, chloride 104, CO2 28. Troponin less than 0.02. Lipase 3346. Repeat lipase showed improvement and came down to 1160, and subsequently down to 649. The patient also underwent a CT scan of the abdomen and pelvis which showed a 1 cm calcified splenic artery aneurysm, prominent coronary artery calcification, atherosclerotic changes in the aorta and common iliac arteries, mild intrahepatic biliary duct prominence felt to be within the range of normal, radiopaque gravel within the dependent portion of the gallbladder, without CT evidence of inflammation. Adenoma of the right adrenal gland was suspected, and a slightly  lobulated contour of the prostate gland was noted. The patient also underwent a magnetic resonance  cholangiopancreatogram which showed small gallstones and/or sludge, without biliary ductal dilatation or choledocholithiasis and subtle findings which are most likely related to chronic pancreatitis. There was a 1.0 cm cystic focus within the pancreatic head/uncinate process junction, and this was felt to be as a result of pseudocyst, and indolent neoplasm, such as an intraductal papillary mucinous tumor could also look similar, and [name] was advised to have a repeat MRI or CT in 1 year. The patient recovered well, and his abdominal pain pretty much resolved. His diet was advanced, and [name] was stable at the time of discharge.   DISCHARGE MEDICATIONS: The patient was discharged on the following medications.  1. Isosorbide mononitrate 30 mg once a day.  2. Aspirin 81 mg a day.  3. Pravastatin 20 mg once a day, lisinopril 10 mg once a day.  4. Omeprazole 20 mg once a day.  5. Amiodarone 200 mg once a day.   DISCHARGE INSTRUCTIONS: The patient has been advised a low fat, low cholesterol diet and to follow up with me, Dr. Marcello FennelHande, and also to follow up with Dr. Marva PandaSkulskie, gastroenterologist, in 2 to 3 weeks. The patient has been advised to call back or report to the ER if [name] has worsening abdominal pain at any time.   TOTAL TIME SPENT ON DISCHARGING THIS PATIENT: 35 minutes.  ____________________________ Barbette ReichmannVishwanath Alida Greiner, MD vh:lb D: 11/20/2013 12:52:57 ET T: 11/20/2013 13:18:57 ET JOB#: [id number]  cc: Barbette ReichmannVishwanath Baltazar Pekala, MD, <Dictator> Barbette ReichmannVISHWANATH Keisi Eckford MD ELECTRONICALLY SIGNED 12/24/2013 13:39

## 2015-01-10 NOTE — Op Note (Signed)
PATIENT NAME:  [name], [name] MR#:  [id number] DATE OF BIRTH:  1931-06-19  DATE OF PROCEDURE:  11/22/2011  PREOPERATIVE DIAGNOSIS: Bilateral inguinal hernias.   POSTOPERATIVE DIAGNOSIS: Bilateral inguinal hernias.  PROCEDURE: Bilateral inguinal hernia repairs.   SURGEON: Adella HareJ. Wilton Moise Friday, MD   ANESTHESIA: General.   INDICATIONS: This 81 year old male has a history of bulging in both groins. Bilateral inguinal hernias were demonstrated on physical exam, slightly larger on the left side, and repairs were recommended for definitive treatment.   DESCRIPTION OF PROCEDURE: The patient was placed on the operating table in the supine position under general anesthesia. The lower abdomen was clipped and prepared with ChloraPrep and draped in a sterile manner.   First on the left side a transversely oriented suprapubic incision was made, carried down through subcutaneous tissues. One traversing vein was divided between 4-0 Vicryl suture ligatures. Scarpa's fascia was incised. The external oblique aponeurosis was incised along the course of its fibers to open the external ring and expose the inguinal cord structures. The inguinal cord structures and ilioinguinal nerve were retracted laterally as a Penrose drain was passed around. Cremaster fibers were spread. There was no indirect sac. There was, however, a large direct inguinal hernia. This was dissected and circumferential incision was made in the attenuated transversalis fascia. The sac itself was approximately 2.5 inches in length. Sac was inverted and separated from the transversalis fascia. Next, the defect was closed with a running 0 Surgilon suture which was placed in the transversalis fascia completely obliterating the defect. Next, only Atrium mesh was cut to create an oval shape of some 2.5 x 4 cm with a notch cut out for the cord structures. This was placed along the floor of the inguinal canal overlying the repair. It was sutured laterally to the  shelving edge of the inguinal ligament and medially to the internal oblique fascia and also sutured bilaterally around the cord structures and also was sutured to the repair itself. Repair looked good. Hemostasis was intact. The cut edges of the external oblique aponeurosis were closed with a running 4-0 Monocryl. The deep fascia superior and lateral to the repair site was infiltrated with 0.5% Sensorcaine with epinephrine. Subcutaneous tissues were infiltrated. The Scarpa's fascia was closed with interrupted 4-0 Monocryl. The skin was closed with running 4-0 Monocryl subcuticular suture.   Next, a mirror image incision was made on the right side and divided a vein between 4-0 Vicryl ligatures. Scarpa's fascia was incised. External oblique aponeurosis was incised along the course of its fibers. The cord structures were mobilized along with the ilioinguinal nerve and there was no indirect sac. There was a slightly smaller but similar direct inguinal hernia. This was further dissected and circumferential dissection was carried to incise the attenuated transversalis fascia. The sac was inverted and the defect in the transversalis fascia closed with a running 0 Surgilon suture. Next, a similar mesh was cut and placed as an onlay patch sutured to the shelving edge of the inguinal ligament and also sutured to the repair and also to the internal oblique fascia and also sutured around the internal ring. Repair looked good. Hemostasis was intact. Cord structures and ilioinguinal nerve were replaced along the floor of the inguinal canal. Cut edges of the external oblique aponeurosis were closed with a running 4-0 Monocryl. The fascia superior and lateral to the repair site was infiltrated with 0.5% Sensorcaine with epinephrine. Subcutaneous tissues were infiltrated. Scarpa's fascia was closed with interrupted 4-0 Monocryl. The skin was  closed with running 4-0 Monocryl subcuticular suture.   Next, both wounds were treated  with Dermabond and allowed to dry. The patient tolerated surgery satisfactorily and was subsequently prepared for transfer to the recovery room.  ____________________________ [name]. Joseph Rolls, MD jws:drc D: 11/22/2011 09:41:35 ET T: 11/22/2011 10:57:45 ET JOB#: [id number]  cc: Adella Hare, MD, <Dictator> Adella Hare MD ELECTRONICALLY SIGNED 11/24/2011 9:40

## 2015-03-22 ENCOUNTER — Encounter: Payer: Self-pay | Admitting: Emergency Medicine

## 2015-03-22 ENCOUNTER — Inpatient Hospital Stay
Admit: 2015-03-22 | Discharge: 2015-03-22 | Disposition: A | Discharge status: Home / Self Care | Payer: PPO | Attending: Internal Medicine | Admitting: Internal Medicine

## 2015-03-22 ENCOUNTER — Emergency Department: Payer: PPO

## 2015-03-22 ENCOUNTER — Inpatient Hospital Stay
Admission: EM | Admit: 2015-03-22 | Discharge: 2015-03-24 | DRG: 291 | Disposition: A | Discharge status: Home / Self Care | Payer: PPO | Attending: Internal Medicine | Admitting: Internal Medicine

## 2015-03-22 DIAGNOSIS — I252 Old myocardial infarction: Secondary | ICD-10-CM

## 2015-03-22 DIAGNOSIS — Z8249 Family history of ischemic heart disease and other diseases of the circulatory system: Secondary | ICD-10-CM | POA: Diagnosis not present

## 2015-03-22 DIAGNOSIS — K219 Gastro-esophageal reflux disease without esophagitis: Secondary | ICD-10-CM | POA: Diagnosis present

## 2015-03-22 DIAGNOSIS — I10 Essential (primary) hypertension: Secondary | ICD-10-CM | POA: Diagnosis present

## 2015-03-22 DIAGNOSIS — Z79899 Other long term (current) drug therapy: Secondary | ICD-10-CM

## 2015-03-22 DIAGNOSIS — I5023 Acute on chronic systolic (congestive) heart failure: Secondary | ICD-10-CM | POA: Diagnosis present

## 2015-03-22 DIAGNOSIS — I2510 Atherosclerotic heart disease of native coronary artery without angina pectoris: Secondary | ICD-10-CM | POA: Diagnosis present

## 2015-03-22 DIAGNOSIS — J9601 Acute respiratory failure with hypoxia: Secondary | ICD-10-CM | POA: Diagnosis present

## 2015-03-22 DIAGNOSIS — J9600 Acute respiratory failure, unspecified whether with hypoxia or hypercapnia: Secondary | ICD-10-CM | POA: Diagnosis present

## 2015-03-22 DIAGNOSIS — I429 Cardiomyopathy, unspecified: Secondary | ICD-10-CM | POA: Diagnosis present

## 2015-03-22 DIAGNOSIS — J810 Acute pulmonary edema: Secondary | ICD-10-CM

## 2015-03-22 DIAGNOSIS — Z955 Presence of coronary angioplasty implant and graft: Secondary | ICD-10-CM | POA: Diagnosis not present

## 2015-03-22 DIAGNOSIS — Z87891 Personal history of nicotine dependence: Secondary | ICD-10-CM

## 2015-03-22 DIAGNOSIS — I480 Paroxysmal atrial fibrillation: Secondary | ICD-10-CM | POA: Diagnosis present

## 2015-03-22 DIAGNOSIS — Z7901 Long term (current) use of anticoagulants: Secondary | ICD-10-CM

## 2015-03-22 DIAGNOSIS — E785 Hyperlipidemia, unspecified: Secondary | ICD-10-CM | POA: Diagnosis present

## 2015-03-22 DIAGNOSIS — I509 Heart failure, unspecified: Secondary | ICD-10-CM

## 2015-03-22 HISTORY — DX: Unspecified atrial fibrillation: I48.91

## 2015-03-22 HISTORY — DX: Anemia, unspecified: D64.9

## 2015-03-22 HISTORY — DX: Hyperlipidemia, unspecified: E78.5

## 2015-03-22 HISTORY — DX: Chronic systolic (congestive) heart failure: I50.22

## 2015-03-22 HISTORY — DX: Atherosclerotic heart disease of native coronary artery without angina pectoris: I25.10

## 2015-03-22 HISTORY — DX: Gastro-esophageal reflux disease without esophagitis: K21.9

## 2015-03-22 HISTORY — DX: Acute pancreatitis without necrosis or infection, unspecified: K85.90

## 2015-03-22 HISTORY — DX: Old myocardial infarction: I25.2

## 2015-03-22 HISTORY — DX: Essential (primary) hypertension: I10

## 2015-03-22 LAB — CBC
HCT: 41.5 % (ref 40.0–52.0)
Hemoglobin: 13.8 g/dL (ref 13.0–18.0)
MCH: 32.6 pg (ref 26.0–34.0)
MCHC: 33.2 g/dL (ref 32.0–36.0)
MCV: 98.3 fL (ref 80.0–100.0)
Platelets: 236 10*3/uL (ref 150–440)
RBC: 4.22 MIL/uL — AB (ref 4.40–5.90)
RDW: 13 % (ref 11.5–14.5)
WBC: 9.5 10*3/uL (ref 3.8–10.6)

## 2015-03-22 LAB — COMPREHENSIVE METABOLIC PANEL
ALT: 15 U/L — ABNORMAL LOW (ref 17–63)
ANION GAP: 9 (ref 5–15)
AST: 19 U/L (ref 15–41)
Albumin: 3.7 g/dL (ref 3.5–5.0)
Alkaline Phosphatase: 78 U/L (ref 38–126)
BUN: 25 mg/dL — ABNORMAL HIGH (ref 6–20)
CHLORIDE: 107 mmol/L (ref 101–111)
CO2: 23 mmol/L (ref 22–32)
Calcium: 9 mg/dL (ref 8.9–10.3)
Creatinine, Ser: 1.13 mg/dL (ref 0.61–1.24)
GFR, EST NON AFRICAN AMERICAN: 59 mL/min — AB (ref >60)
Glucose, Bld: 127 mg/dL — ABNORMAL HIGH (ref 65–99)
Potassium: 3.9 mmol/L (ref 3.5–5.1)
Sodium: 139 mmol/L (ref 135–145)
Total Bilirubin: 0.5 mg/dL (ref 0.3–1.2)
Total Protein: 7.1 g/dL (ref 6.5–8.1)

## 2015-03-22 LAB — TROPONIN I
Troponin I: <0.03 ng/mL (ref <0.031)
Troponin I: <0.03 ng/mL (ref <0.031)
Troponin I: <0.03 ng/mL (ref <0.031)

## 2015-03-22 LAB — BRAIN NATRIURETIC PEPTIDE: B NATRIURETIC PEPTIDE 5: 574 pg/mL — AB (ref 0.0–100.0)

## 2015-03-22 MED ORDER — POTASSIUM CHLORIDE CRYS ER 20 MEQ PO TBCR
40.0000 meq | EXTENDED_RELEASE_TABLET | Freq: Every day | ORAL | Status: AC
  Administered 2015-03-22 – 2015-03-23 (×2): 40 meq via ORAL
  Filled 2015-03-22: qty 2

## 2015-03-22 MED ORDER — PNEUMOCOCCAL VAC POLYVALENT 25 MCG/0.5ML IJ INJ
0.5000 mL | INJECTION | INTRAMUSCULAR | Status: AC
  Administered 2015-03-23: 0.5 mL via INTRAMUSCULAR
  Filled 2015-03-22: qty 0.5

## 2015-03-22 MED ORDER — FUROSEMIDE 10 MG/ML IJ SOLN
40.0000 mg | Freq: Three times a day (TID) | INTRAMUSCULAR | Status: DC
  Administered 2015-03-22 – 2015-03-23 (×3): 40 mg via INTRAVENOUS
  Filled 2015-03-22 (×3): qty 4

## 2015-03-22 MED ORDER — METOPROLOL TARTRATE 100 MG PO TABS
100.0000 mg | ORAL_TABLET | Freq: Two times a day (BID) | ORAL | Status: DC
  Administered 2015-03-22 – 2015-03-24 (×4): 100 mg via ORAL
  Filled 2015-03-22 (×4): qty 1

## 2015-03-22 MED ORDER — AMIODARONE HCL 200 MG PO TABS
400.0000 mg | ORAL_TABLET | Freq: Every day | ORAL | Status: DC
  Administered 2015-03-23 – 2015-03-24 (×2): 400 mg via ORAL
  Filled 2015-03-22 (×2): qty 2

## 2015-03-22 MED ORDER — FUROSEMIDE 10 MG/ML IJ SOLN
INTRAMUSCULAR | Status: AC
  Administered 2015-03-22: 40 mg
  Filled 2015-03-22: qty 4

## 2015-03-22 MED ORDER — ONDANSETRON HCL 4 MG/2ML IJ SOLN: 4.0000 mg | Freq: Four times a day (QID) | INTRAMUSCULAR | Status: DC | PRN

## 2015-03-22 MED ORDER — DOCUSATE SODIUM 100 MG PO CAPS
100.0000 mg | ORAL_CAPSULE | Freq: Two times a day (BID) | ORAL | Status: DC
  Administered 2015-03-23 – 2015-03-24 (×2): 100 mg via ORAL
  Filled 2015-03-22 (×4): qty 1

## 2015-03-22 MED ORDER — ONDANSETRON HCL 4 MG PO TABS: 4.0000 mg | ORAL_TABLET | Freq: Four times a day (QID) | ORAL | Status: DC | PRN

## 2015-03-22 MED ORDER — VITAMIN E 180 MG (400 UNIT) PO CAPS
400.0000 [IU] | ORAL_CAPSULE | Freq: Every day | ORAL | Status: DC
  Administered 2015-03-23 – 2015-03-24 (×2): 400 [IU] via ORAL
  Filled 2015-03-22 (×2): qty 1

## 2015-03-22 MED ORDER — SODIUM CHLORIDE 0.9 % IJ SOLN: 3.0000 mL | INTRAMUSCULAR | Status: DC | PRN

## 2015-03-22 MED ORDER — APIXABAN 5 MG PO TABS
5.0000 mg | ORAL_TABLET | Freq: Two times a day (BID) | ORAL | Status: DC
  Administered 2015-03-22 – 2015-03-24 (×4): 5 mg via ORAL
  Filled 2015-03-22 (×4): qty 1

## 2015-03-22 MED ORDER — ALBUTEROL SULFATE (2.5 MG/3ML) 0.083% IN NEBU: 2.5000 mg | INHALATION_SOLUTION | RESPIRATORY_TRACT | Status: DC | PRN

## 2015-03-22 MED ORDER — POTASSIUM CHLORIDE CRYS ER 20 MEQ PO TBCR
EXTENDED_RELEASE_TABLET | ORAL | Status: AC
  Administered 2015-03-22: 40 meq via ORAL
  Filled 2015-03-22: qty 2

## 2015-03-22 MED ORDER — SODIUM CHLORIDE 0.9 % IJ SOLN
3.0000 mL | Freq: Two times a day (BID) | INTRAMUSCULAR | Status: DC
  Administered 2015-03-22 – 2015-03-23 (×4): 3 mL via INTRAVENOUS

## 2015-03-22 MED ORDER — SODIUM CHLORIDE 0.9 % IJ SOLN: 3.0000 mL | Freq: Two times a day (BID) | INTRAMUSCULAR | Status: DC

## 2015-03-22 MED ORDER — SODIUM CHLORIDE 0.9 % IV SOLN: 250.0000 mL | INTRAVENOUS | Status: DC | PRN

## 2015-03-22 MED ORDER — NITROGLYCERIN 0.4 MG SL SUBL: 0.4000 mg | SUBLINGUAL_TABLET | SUBLINGUAL | Status: DC | PRN

## 2015-03-22 MED ORDER — ACETAMINOPHEN 325 MG PO TABS
650.0000 mg | ORAL_TABLET | Freq: Four times a day (QID) | ORAL | Status: DC | PRN
Start: 2015-03-22 — End: 2015-03-24

## 2015-03-22 MED ORDER — PANTOPRAZOLE SODIUM 40 MG PO TBEC
40.0000 mg | DELAYED_RELEASE_TABLET | Freq: Every day | ORAL | Status: DC
  Administered 2015-03-23 – 2015-03-24 (×2): 40 mg via ORAL
  Filled 2015-03-22 (×2): qty 1

## 2015-03-22 MED ORDER — ACETAMINOPHEN 650 MG RE SUPP: 650.0000 mg | Freq: Four times a day (QID) | RECTAL | Status: DC | PRN

## 2015-03-22 NOTE — ED Notes (Signed)
Pt reports that his blood pressure and his heart rate has been high, has been to cardiologist had medications changed but they arent helping. Answering service for MD is not calling back.

## 2015-03-22 NOTE — Progress Notes (Signed)
Patient admitted, oriented to room and call light. Skin assessment verified Robyn Haberaylor Bailey, RN. Safety contract reviewed. Son at bedside, no complaints at this time. Trudee KusterBrandi R Tuckey

## 2015-03-22 NOTE — Progress Notes (Signed)
Patient seen and also being seen by cardiology . Note reviewed. Continue care per admitting doc and cardiology consult Acute on chronic systolic congestive heart failure - IV Lasix, Beta blockers - Input and Output - Counseled to limit fluids and Salt - Monitor Bun/Cr and Potassium - Echo -Cardiology consult  * Acute respiratory failure, hypoxic Due to CHF Oxygen to keep saturations greater than 90%  * Paroxysmal atrial fibrillation  Patient is on metoprolol and Eliquis. Presently normal sinus rhythm. Does have sinus tachycardia. Increased metoprolol from 75 mg 2 times a day to 100 mg 2 times a day  FITZGERALD, DAVID   03/22/2015, 1:10 PM

## 2015-03-22 NOTE — H&P (Signed)
[facility] - [place] at [facility]   PATIENT NAME: [name]    MR#:  [id number]  DATE OF BIRTH:  January 13, 1931  DATE OF ADMISSION:  03/22/2015  PRIMARY CARE PHYSICIAN: Barbette ReichmannHANDE,VISHWANATH, MD   REQUESTING/REFERRING PHYSICIAN: Dr. Cyril LoosenKinner  CHIEF COMPLAINT:   Chief Complaint  Patient presents with  . Hypertension  SOB  HISTORY OF PRESENT ILLNESS:  [name]  is a 81 y.o. male with a known history of CAD, HTN, pAfib, Chronic systolic chf here with progressively worsening SOB ad orthopnea of 4 days. Also had palpitations. Dry cough. No CP. No edema. Normal UOP. Recently had BP meds changed due to hypotension and has had uncontrolled BP since. Sats < 85% on RA in ED. Needing 3 L O2.  PAST MEDICAL HISTORY:   Past Medical History  Diagnosis Date  . Hypertension   . Atrial fibrillation   . MI, old   . CAD (coronary artery disease)   . Pancreatitis   . GERD (gastroesophageal reflux disease)   . Anemia   . Chronic systolic CHF (congestive heart failure)   . Hyperlipemia     PAST SURGICAL HISTORY:   Past Surgical History  Procedure Laterality Date  . Cardiac catheterization      SOCIAL HISTORY:   History  Substance Use Topics  . Smoking status: Never Smoker   . Smokeless tobacco: Not on file  . Alcohol Use: No    FAMILY HISTORY:   Family History  Problem Relation Age of Onset  . Hypertension Father   . Hyperlipidemia Father     DRUG ALLERGIES:   Allergies  Allergen Reactions  . Lipitor [Atorvastatin]     weakness    REVIEW OF SYSTEMS:   Review of Systems  Constitutional: Positive for malaise/fatigue. Negative for fever, chills and weight loss.  HENT: Negative for hearing loss and nosebleeds.   Eyes: Negative for blurred vision, double vision and pain.  Respiratory: Positive for cough and shortness of breath. Negative for hemoptysis, sputum production and wheezing.   Cardiovascular: Positive for palpitations and  orthopnea. Negative for chest pain and leg swelling.  Gastrointestinal: Negative for nausea, vomiting, abdominal pain, diarrhea and constipation.  Genitourinary: Negative for dysuria and hematuria.  Musculoskeletal: Positive for joint pain. Negative for myalgias, back pain and falls.  Skin: Negative for rash.  Neurological: Positive for weakness. Negative for dizziness, tremors, sensory change, speech change, focal weakness, seizures and headaches.  Endo/Heme/Allergies: Does not bruise/bleed easily.  Psychiatric/Behavioral: Negative for depression and memory loss. The patient is not nervous/anxious.     MEDICATIONS AT HOME:   Prior to Admission medications   Medication Sig Start Date End Date Taking? Authorizing Provider  amiodarone (PACERONE) 400 MG tablet Take 400 mg by mouth daily. 03/10/15  Yes Historical Provider, MD  ELIQUIS 5 MG TABS tablet Take 5 mg by mouth 2 (two) times daily. 02/27/15  Yes Historical Provider, MD  metoprolol (LOPRESSOR) 100 MG tablet Take 50 mg by mouth 2 (two) times daily. Take along with half tablet of a 50 mg tablet to equal 75 mg total.   Yes Historical Provider, MD  metoprolol (LOPRESSOR) 50 MG tablet Take 25 mg by mouth 2 (two) times daily. Take along with half tablet of 100 tablet to equal 75 mg total.   Yes Historical Provider, MD  Multiple Vitamin (MULTI-VITAMINS) TABS Take 1 tablet by mouth daily.   Yes Historical Provider, MD  nitroGLYCERIN (NITROSTAT) 0.4 MG SL tablet Place 0.4 mg under the tongue  every 5 (five) minutes x 3 doses as needed. For chest pain. If no relief call MD or go to emergency room.   Yes Historical Provider, MD  omeprazole (PRILOSEC) 20 MG capsule Take 20 mg by mouth 2 (two) times daily before a meal.   Yes Historical Provider, MD  vitamin E 400 UNIT capsule Take 400 Units by mouth daily.   Yes Historical Provider, MD      VITAL SIGNS:  Blood pressure 135/91, pulse 106, temperature 97.8 F (36.6 C), resp. rate 20, height   (1.778 m), weight 74.844 kg (165 lb), SpO2 95 %.  PHYSICAL EXAMINATION:  Physical Exam  GENERAL:  81 y.o.-year-old patient lying in the bed with respiratory distress EYES: Pupils equal, round, reactive to light and accommodation. No scleral icterus. Extraocular muscles intact.  HEENT: Head atraumatic, normocephalic. Oropharynx and nasopharynx clear. No oropharyngeal erythema, moist oral mucosa  NECK:  Supple, no jugular venous distention. No thyroid enlargement, no tenderness.  LUNGS: Increased work of breathing. Bilateral crackles. CARDIOVASCULAR: S1, S2 normal. No murmurs, rubs, or gallops.  ABDOMEN: Soft, nontender, nondistended. Bowel sounds present. No organomegaly or mass.  EXTREMITIES: No pedal edema, cyanosis, or clubbing. + 2 pedal & radial pulses b/l.   NEUROLOGIC: Cranial nerves II through XII are intact. No focal Motor or sensory deficits appreciated b/l PSYCHIATRIC: The patient is alert and oriented x 3. Good affect.  SKIN: No obvious rash, lesion, or ulcer.   LABORATORY PANEL:   CBC  Recent Labs Lab 03/22/15 0900  WBC 9.5  HGB 13.8  HCT 41.5  PLT 236   ------------------------------------------------------------------------------------------------------------------  Chemistries   Recent Labs Lab 03/22/15 0900  NA 139  K 3.9  CL 107  CO2 23  GLUCOSE 127*  BUN 25*  CREATININE 1.13  CALCIUM 9.0  AST 19  ALT 15*  ALKPHOS 78  BILITOT 0.5   ------------------------------------------------------------------------------------------------------------------  Cardiac Enzymes  Recent Labs Lab 03/22/15 0900  TROPONINI <0.03   ------------------------------------------------------------------------------------------------------------------  RADIOLOGY:  Dg Chest Port 1 View  03/22/2015   CLINICAL DATA:  Elevated blood pressure heart rate, shortness of breath, history hypertension, atrial fibrillation, prior MI  EXAM: PORTABLE CHEST - 1 VIEW  COMPARISON:   Portable exam 0847 hours compared to 03/26/2014  FINDINGS: Enlargement of cardiac silhouette with pulmonary vascular congestion.  Perihilar and basilar infiltrates favoring mild pulmonary edema and CHF.  Small bibasilar pleural effusions and minimal RIGHT basilar atelectasis.  No pneumothorax.  Bones unremarkable.  IMPRESSION: Probable mild CHF.   Electronically Signed   By: Ulyses Southward M.D.   On: 03/22/2015 09:09     IMPRESSION AND PLAN:   * Acute on chronic systolic congestive heart failure - IV Lasix, Beta blockers - Input and Output - Counseled to limit fluids and Salt - Monitor Bun/Cr and Potassium - Echo -Cardiology consult  * Acute respiratory failure, hypoxic Due to CHF Oxygen to keep saturations greater than 90%  * Paroxysmal atrial fibrillation Patient is on metoprolol and Eliquis. Presently normal sinus rhythm. Does have sinus tachycardia. Increase metoprolol from 75 mg 2 times a day to 100 mg 2 times a day.  * CAD Stable  * DVT prophylaxis O Eliquis    All the records are reviewed and case discussed with ED provider. Management plans discussed with the patient, family and they are in agreement.  CODE STATUS: FULL  TOTAL TIME TAKING CARE OF THIS PATIENT: 40 minutes.    Milagros Loll R M.D on 03/22/2015 at 10:48 AM  Between 7am to 6pm - Pager - [phone]  After 6pm go to [email or web address] - password [email or web address] [facility]  [facility]  Office  [phone]  CC: Primary care physician; Barbette Reichmann, MD

## 2015-03-22 NOTE — ED Provider Notes (Signed)
[facility] Emergency Department Provider Note  ____________________________________________  Time seen: On arrival  I have reviewed the triage vital signs and the nursing notes.   HISTORY  Chief Complaint Hypertension    HPI [name] is a 81 y.o. male who presents with complaints of high heart rate and elevated blood pressure. His cardiologist Dr. Gwen PoundsKowalski has been working with him to control this for the last month and has had several adjustments in his metoprolol. Patient has a history of coronary artery disease with a stent, and atrial fibrillation and is on eliquis. Patient also notes over the last 2 days he can't sleep at night while lying down because he feels short of breath. He also becomes more short of breath when exerting himself. He denies chest pain. he denies leg swelling     Past Medical History  Diagnosis Date  . Hypertension   . Atrial fibrillation   . MI, old     There are no active problems to display for this patient.   History reviewed. No pertinent past surgical history.  Current Outpatient Rx  Name  Route  Sig  Dispense  Refill  . metoprolol (LOPRESSOR) 50 MG tablet   Oral   Take 75 mg by mouth 2 (two) times daily.         Marland Kitchen. omeprazole (PRILOSEC) 20 MG capsule   Oral   Take 20 mg by mouth 2 (two) times daily before a meal.           Allergies Lipitor  History reviewed. No pertinent family history.  Social History History  Substance Use Topics  . Smoking status: Never Smoker   . Smokeless tobacco: Not on file  . Alcohol Use: No    Review of Systems  Constitutional: Negative for fever. Eyes: Negative for visual changes. ENT: Negative for sore throat Cardiovascular: Negative for chest pain. Respiratory: Positive for shortness of breath Gastrointestinal: Negative for abdominal pain, vomiting and diarrhea. Genitourinary: Negative for dysuria. Musculoskeletal: Negative for back pain. Skin:  Negative for rash. Neurological: Negative for headaches or focal weakness Psychiatric: No anxiety  10-point ROS otherwise negative.  ____________________________________________   PHYSICAL EXAM:  VITAL SIGNS: ED Triage Vitals  Enc Vitals Group     BP 03/22/15 0826 151/103 mmHg     Pulse Rate 03/22/15 0826 107     Resp 03/22/15 0826 18     Temp 03/22/15 0826 97.8 F (36.6 C)     Temp src --      SpO2 03/22/15 0826 97 %     Weight 03/22/15 0826 165 lb (74.844 kg)     Height 03/22/15 0826 5\' 10"  (1.778 m)     Head Cir --      Peak Flow --      Pain Score --      Pain Loc --      Pain Edu? --      Excl. in GC? --      Constitutional: Alert and oriented. Well appearing and in no distress. Pleasant and interactive Eyes: Conjunctivae are normal.  ENT   Head: Normocephalic and atraumatic.   Mouth/Throat: Mucous membranes are moist. Cardiovascular: Mild tachycardia, regular rhythm. Normal and symmetric distal pulses are present in all extremities. No murmurs, rubs, or gallops. Respiratory: Normal respiratory effort without tachypnea nor retractions. Rales bibasilarly Gastrointestinal: Soft and non-tender in all quadrants. No distention. There is no CVA tenderness. Genitourinary: deferred Musculoskeletal: Nontender with normal range of motion in all extremities.  No lower extremity tenderness nor edema. Neurologic:  Normal speech and language. No gross focal neurologic deficits are appreciated. Skin:  Skin is warm, dry and intact. No rash noted. Psychiatric: Mood and affect are normal. Patient exhibits appropriate insight and judgment.  ____________________________________________    LABS (pertinent positives/negatives)  Labs Reviewed  COMPREHENSIVE METABOLIC PANEL  CBC  TROPONIN I  BRAIN NATRIURETIC PEPTIDE    ____________________________________________   EKG  ED ECG REPORT   I, Jene Every, the attending physician, personally viewed and interpreted  this ECG.   Date: 03/22/2015  EKG Time: 8:41 AM  Rate: 107  Rhythm: sinus tachycardia  Axis: Left axis  Intervals:left bundle branch block  ST&T Change: Nonspecific   Left bundle branch block is not new  ____________________________________________    RADIOLOGY I have personally reviewed any xrays that were ordered on this patient:  Chest x-ray consistent with pulmonary edema  ____________________________________________   PROCEDURES  Procedure(s) performed: none  Critical Care performed: none  ____________________________________________   INITIAL IMPRESSION / ASSESSMENT AND PLAN / ED COURSE  Pertinent labs & imaging results that were available during my care of the patient were reviewed by me and considered in my medical decision making (see chart for details).  Patient's history is concerning for new onset CHF number we will obtain labs, chest x-ray and reevaluate.  ----------------------------------------- 9:58 AM on 03/22/2015 -----------------------------------------  Elevated BNP, chest x-ray consistent with pulmonary edema, hypoxia, history of present illness all suggest new-onset CHF. We will admit Lasix 40 mg in the emergency department and admit for further management  ____________________________________________   FINAL CLINICAL IMPRESSION(S) / ED DIAGNOSES  Final diagnoses:  Acute pulmonary edema  Acute congestive heart failure, unspecified congestive heart failure type     Jene Every, MD 03/22/15 1004

## 2015-03-22 NOTE — Consult Note (Signed)
Reason for Consult:CHF/SOB/AFIB Referring Physician: Dr Darvin Neighbours hospitalist  [name] is an 81 y.o. male.  HPI: Pt is a 81 y/o WM with known CM CAD HTN AFIB systolic heart failure who c/o worsing sob cough and weakness. He also has had palp and tachy. He had hypoxemia. Pt denies cp. He had some leg swelling. No blackout spells or syncope. He states over the past few days his SOB had gotten worst. He finally came to ER for evaluation  Past Medical History  Diagnosis Date  . Hypertension   . Atrial fibrillation   . MI, old   . CAD (coronary artery disease)   . Pancreatitis   . GERD (gastroesophageal reflux disease)   . Anemia   . Chronic systolic CHF (congestive heart failure)   . Hyperlipemia     Past Surgical History  Procedure Laterality Date  . Cardiac catheterization      Family History  Problem Relation Age of Onset  . Hypertension Father   . Hyperlipidemia Father     Social History:  reports that he has quit smoking. He does not have any smokeless tobacco history on file. He reports that he does not drink alcohol or use illicit drugs.  Allergies:  Allergies  Allergen Reactions  . Lipitor [Atorvastatin]     weakness    Medications:  Prior to Admission:  Prescriptions prior to admission  Medication Sig Dispense Refill Last Dose  . amiodarone (PACERONE) 400 MG tablet Take 400 mg by mouth daily.  11 03/21/2015 at Unknown time  . ELIQUIS 5 MG TABS tablet Take 5 mg by mouth 2 (two) times daily.  11 03/22/2015 at Unknown time  . metoprolol (LOPRESSOR) 100 MG tablet Take 50 mg by mouth 2 (two) times daily. Take along with half tablet of a 50 mg tablet to equal 75 mg total.   03/22/2015 at 0600  . metoprolol (LOPRESSOR) 50 MG tablet Take 25 mg by mouth 2 (two) times daily. Take along with half tablet of 100 tablet to equal 75 mg total.   03/22/2015 at 0600  . Multiple Vitamin (MULTI-VITAMINS) TABS Take 1 tablet by mouth daily.   03/21/2015 at Unknown time  . nitroGLYCERIN  (NITROSTAT) 0.4 MG SL tablet Place 0.4 mg under the tongue every 5 (five) minutes x 3 doses as needed. For chest pain. If no relief call MD or go to emergency room.   prn  . omeprazole (PRILOSEC) 20 MG capsule Take 20 mg by mouth 2 (two) times daily before a meal.   03/22/2015 at Unknown time  . vitamin E 400 UNIT capsule Take 400 Units by mouth daily.   03/22/2015 at Unknown time    Results for orders placed or performed during the hospital encounter of 03/22/15 (from the past 48 hour(s))  Comprehensive metabolic panel     Status: Abnormal   Collection Time: 03/22/15  9:00 AM  Result Value Ref Range   Sodium 139 135 - 145 mmol/L   Potassium 3.9 3.5 - 5.1 mmol/L   Chloride 107 101 - 111 mmol/L   CO2 23 22 - 32 mmol/L   Glucose, Bld 127 (H) 65 - 99 mg/dL   BUN 25 (H) 6 - 20 mg/dL   Creatinine, Ser 1.13 0.61 - 1.24 mg/dL   Calcium 9.0 8.9 - 10.3 mg/dL   Total Protein 7.1 6.5 - 8.1 g/dL   Albumin 3.7 3.5 - 5.0 g/dL   AST 19 15 - 41 U/L   ALT 15 (L)  17 - 63 U/L   Alkaline Phosphatase 78 38 - 126 U/L   Total Bilirubin 0.5 0.3 - 1.2 mg/dL   GFR calc non Af Amer 59 (L) >60 mL/min   GFR calc Af Amer >60 >60 mL/min    Comment: (NOTE) The eGFR has been calculated using the CKD EPI equation. This calculation has not been validated in all clinical situations. eGFR's persistently <60 mL/min signify possible Chronic Kidney Disease.    Anion gap 9 5 - 15  CBC     Status: Abnormal   Collection Time: 03/22/15  9:00 AM  Result Value Ref Range   WBC 9.5 3.8 - 10.6 K/uL   RBC 4.22 (L) 4.40 - 5.90 MIL/uL   Hemoglobin 13.8 13.0 - 18.0 g/dL   HCT 41.5 40.0 - 52.0 %   MCV 98.3 80.0 - 100.0 fL   MCH 32.6 26.0 - 34.0 pg   MCHC 33.2 32.0 - 36.0 g/dL   RDW 13.0 11.5 - 14.5 %   Platelets 236 150 - 440 K/uL  Troponin I     Status: None   Collection Time: 03/22/15  9:00 AM  Result Value Ref Range   Troponin I <0.03 <0.031 ng/mL    Comment:        NO INDICATION OF MYOCARDIAL INJURY.   Brain  natriuretic peptide     Status: Abnormal   Collection Time: 03/22/15  9:00 AM  Result Value Ref Range   B Natriuretic Peptide 574.0 (H) 0.0 - 100.0 pg/mL  Troponin I     Status: None   Collection Time: 03/22/15  3:19 PM  Result Value Ref Range   Troponin I <0.03 <0.031 ng/mL    Comment:        NO INDICATION OF MYOCARDIAL INJURY.     Dg Chest Port 1 View  03/22/2015   CLINICAL DATA:  Elevated blood pressure heart rate, shortness of breath, history hypertension, atrial fibrillation, prior MI  EXAM: PORTABLE CHEST - 1 VIEW  COMPARISON:  Portable exam 7026 hours compared to 03/26/2014  FINDINGS: Enlargement of cardiac silhouette with pulmonary vascular congestion.  Perihilar and basilar infiltrates favoring mild pulmonary edema and CHF.  Small bibasilar pleural effusions and minimal RIGHT basilar atelectasis.  No pneumothorax.  Bones unremarkable.  IMPRESSION: Probable mild CHF.   Electronically Signed   By: Lavonia Dana M.D.   On: 03/22/2015 09:09    Review of Systems  HENT: Negative.   Eyes: Negative.   Respiratory: Positive for shortness of breath.   Cardiovascular: Positive for orthopnea, leg swelling and PND.  Gastrointestinal: Negative.   Genitourinary: Negative.   Musculoskeletal: Negative.   Skin: Negative.   Neurological: Positive for weakness.  Endo/Heme/Allergies: Negative.   Psychiatric/Behavioral: Negative.    Blood pressure 128/72, pulse 117, temperature 98.2 F (36.8 C), temperature source Oral, resp. rate 20, height 5' 10"  (1.778 m), weight 73.483 kg (162 lb), SpO2 96 %. Physical Exam  Constitutional: He is oriented to person, place, and time. He appears well-developed and well-nourished.  HENT:  Head: Normocephalic.  Right Ear: External ear normal.  Eyes: Conjunctivae are normal. Pupils are equal, round, and reactive to light.  Neck: Normal range of motion. Neck supple.  Cardiovascular: S1 normal and S2 normal.  PMI is displaced.  Exam reveals gallop and S3.    Murmur heard.  Systolic murmur is present with a grade of 2/6  Respiratory: Effort normal. He has rales.  GI: Soft. Bowel sounds are normal.  Musculoskeletal: Normal range  of motion.  Neurological: He is alert and oriented to person, place, and time.  Skin: Skin is warm and dry.  Psychiatric: He has a normal mood and affect.    Assessment/Plan: CHF systolic,Acute /chronic AFIB SOB CAD GERD Weakness . PLAN Agree with admit ROMI with tele Continue lasix IV for CHF/SOB Cotinue protonix for GERD Agree with anticoug for AFIB Rate control with lopressor Agree with ECHO for CHF /SOB O2 for hypoxemia and sob Consider adding and ACE for CHF care Continue Amiodarone for AFIB Inhalers for SOB DVT prophylaxis    Phoebie Shad D. 03/22/2015, 9:09 PM

## 2015-03-22 NOTE — Progress Notes (Signed)
Patient resting in bed, no complaints at this time. Afternoon medication administered, bp stable. Heart failure education completed and booklet given to patient. No questions at this time, correctly verbalized teaching.  Joseph KusterBrandi R Remache

## 2015-03-23 DIAGNOSIS — I5023 Acute on chronic systolic (congestive) heart failure: Secondary | ICD-10-CM | POA: Diagnosis not present

## 2015-03-23 LAB — BASIC METABOLIC PANEL
Anion gap: 10 (ref 5–15)
BUN: 30 mg/dL — AB (ref 6–20)
CO2: 27 mmol/L (ref 22–32)
Calcium: 9.2 mg/dL (ref 8.9–10.3)
Chloride: 103 mmol/L (ref 101–111)
Creatinine, Ser: 1.54 mg/dL — ABNORMAL HIGH (ref 0.61–1.24)
GFR calc Af Amer: 47 mL/min — ABNORMAL LOW (ref >60)
GFR calc non Af Amer: 40 mL/min — ABNORMAL LOW (ref >60)
Glucose, Bld: 125 mg/dL — ABNORMAL HIGH (ref 65–99)
Potassium: 3.8 mmol/L (ref 3.5–5.1)
Sodium: 140 mmol/L (ref 135–145)

## 2015-03-23 MED ORDER — FUROSEMIDE 40 MG PO TABS
40.0000 mg | ORAL_TABLET | Freq: Every day | ORAL | Status: DC
  Administered 2015-03-24: 40 mg via ORAL
  Filled 2015-03-23: qty 1

## 2015-03-23 NOTE — Progress Notes (Signed)
   PROGRESS NOTE  Maryruth BunCarl L Vansickle ZOX:[id number] DOB: 1931-05-10 DOA: 03/22/2015 PCP: Barbette ReichmannHANDE,Ashton Sabine, MD  Subjective:   81 y/o male with hx of CAD, HTN, paroxysmal A-fib Systolic CHF admitted with progressive shortness of breath and orthopnea. ECHO results pending Consultants: .Cardiology: Dr.Calwood   Objective: BP 104/71 mmHg  Pulse 112  Temp(Src) 97.9 F (36.6 C) (Oral)  Resp 18  Ht 5\' 10"  (1.778 m)  Wt 72.802 kg (160 lb 8 oz)  BMI 23.03 kg/m2  SpO2 96%  Intake/Output Summary (Last 24 hours) at 03/23/15 1545 Last data filed at 03/23/15 1140  Gross per 24 hour  Intake    603 ml  Output      0 ml  Net    603 ml   Filed Weights   03/22/15 0826 03/22/15 1237 03/23/15 0509  Weight: 74.844 kg (165 lb) 73.483 kg (162 lb) 72.802 kg (160 lb 8 oz)    Exam: [place] AT  General:  Not in distress  Cardiovascular:S1 S2  Respiratory: Clear to auscultation  Abdomen: Soft. Non tender  Neuro:Non Focal   Data Reviewed: Basic Metabolic Panel:  Recent Labs Lab 03/22/15 0900 03/23/15 0352  NA 139 140  K 3.9 3.8  CL 107 103  CO2 23 27  GLUCOSE 127* 125*  BUN 25* 30*  CREATININE 1.13 1.54*  CALCIUM 9.0 9.2   Liver Function Tests:  Recent Labs Lab 03/22/15 0900  AST 19  ALT 15*  ALKPHOS 78  BILITOT 0.5  PROT 7.1  ALBUMIN 3.7   No results for input(s): LIPASE, AMYLASE in the last 168 hours. No results for input(s): AMMONIA in the last 168 hours. CBC:  Recent Labs Lab 03/22/15 0900  WBC 9.5  HGB 13.8  HCT 41.5  MCV 98.3  PLT 236   Cardiac Enzymes:    Recent Labs Lab 03/22/15 0900 03/22/15 1519 03/22/15 2108  TROPONINI <0.03 <0.03 <0.03   BNP (last 3 results)  Recent Labs  03/22/15 0900  BNP 574.0*    ProBNP (last 3 results) No results for input(s): PROBNP in the last 8760 hours.  CBG: No results for input(s): GLUCAP in the last 168 hours.  No results found for this or any previous visit (from the past 240 hour(s)).   Studies: No  results found.  Scheduled Meds: . amiodarone  400 mg Oral Daily  . apixaban  5 mg Oral BID  . docusate sodium  100 mg Oral BID  . furosemide  40 mg Oral Daily  . metoprolol  100 mg Oral BID  . pantoprazole  40 mg Oral Daily  . sodium chloride  3 mL Intravenous Q12H  . vitamin E  400 Units Oral Daily    Continuous Infusions:   Assessment/Plan:  1  Acute on Chronic CHF (congestive heart failure) On IV lasix Change to po lasix given elevated BUN and Creat  2  Acute respiratory failure; Doing better- O2 sats 96%  3 HTN (hypertension):Continue Metoprolol 4 Paroxysmal; A-fib: On Eliquis and Amiodarone    Code Status: Full  Family Communication: son      Norris Brumbach   03/23/2015, 3:45 PM  LOS: 1 day

## 2015-03-23 NOTE — Progress Notes (Signed)
Initial Nutrition Assessment     INTERVENTION: Meals and Snacks: Cater to patient preferences   NUTRITION DIAGNOSIS:  No nutrition diagnosis  GOAL:  Patient will meet greater than or equal to 90% of their needs  MONITOR:   (Energy Intake, Anthropometrics, Electrolyte/Renal Profile, Digestive System,)  REASON FOR ASSESSMENT:   (Diagnosis)    ASSESSMENT:  Pt admitted with acute on chronic CHF, acute respiratory failure  PMHx:  Past Medical History  Diagnosis Date  . Hypertension   . Atrial fibrillation   . MI, old   . CAD (coronary artery disease)   . Pancreatitis   . GERD (gastroesophageal reflux disease)   . Anemia   . Chronic systolic CHF (congestive heart failure)   . Hyperlipemia     Diet Order: Heart Healthy  Current Nutrition: Pt eating 100% of meals  Food/Nutrition-Related History: Appetite good   Medications: lasix, potassium chloride  Electrolyte/Renal Profile and Glucose Profile:   Recent Labs Lab 03/22/15 0900 03/23/15 0352  NA 139 140  K 3.9 3.8  CL 107 103  CO2 23 27  BUN 25* 30*  CREATININE 1.13 1.54*  CALCIUM 9.0 9.2  GLUCOSE 127* 125*   Protein Profile:  Recent Labs Lab 03/22/15 0900  ALBUMIN 3.7    Gastrointestinal Profile: Last BM: 7/4  Anthropometrics:  Height:  Ht Readings from Last 1 Encounters:  03/22/15 5\' 10"  (1.778 m)    Weight:  Wt Readings from Last 1 Encounters:  03/23/15 160 lb 8 oz (72.802 kg)   Filed Weights   03/22/15 0826 03/22/15 1237 03/23/15 0509  Weight: 165 lb (74.844 kg) 162 lb (73.483 kg) 160 lb 8 oz (72.802 kg)     Wt Readings from Last 10 Encounters:  03/23/15 160 lb 8 oz (72.802 kg)    BMI:  Body mass index is 23.03 kg/(m^2).  Skin:  Reviewed, no issues  Diet Order:  Diet Heart Room service appropriate?: Yes; Fluid consistency:: Thin    LOW Care Level  Romelle Starcherate Oluchi Pucci MS, RD, LDN [phone] Pager

## 2015-03-23 NOTE — Progress Notes (Signed)
Alert and oriented. Patient has ambulated in the hall a few times and has been in the chair most of the day. 96% on room air with exertion. No complaints of pain. Reinforced heart failure education. Patient states he has been told he has heart failure before, but has never taken any medications for it.  He states he's had exacerbations a few times in the past. IV lasix transitioned to PO today. Will continue to monitor.

## 2015-03-23 NOTE — Care Management (Signed)
Patient presents from home with shortness of breath and had documented 02 sats of < 85%.  Currently he is sitting up in the chair on room air and sats are 95%.   He doe snot desat with ambulation.  Prior to this admission, patient independent in all adls, driving and is active with aq  g[employer] membership. Lives with his wife of 64 years.  Denies issues accessing medical care.  No needs identified

## 2015-03-24 LAB — CBC WITH DIFFERENTIAL/PLATELET
BASOS ABS: 0.1 10*3/uL (ref 0–0.1)
BASOS PCT: 1 %
EOS PCT: 2 %
Eosinophils Absolute: 0.2 10*3/uL (ref 0–0.7)
HCT: 40.8 % (ref 40.0–52.0)
Hemoglobin: 13.8 g/dL (ref 13.0–18.0)
LYMPHS PCT: 15 %
Lymphs Abs: 1.3 10*3/uL (ref 1.0–3.6)
MCH: 32.9 pg (ref 26.0–34.0)
MCHC: 33.9 g/dL (ref 32.0–36.0)
MCV: 97.1 fL (ref 80.0–100.0)
Monocytes Absolute: 1.1 10*3/uL — ABNORMAL HIGH (ref 0.2–1.0)
Monocytes Relative: 13 %
Neutro Abs: 6.2 10*3/uL (ref 1.4–6.5)
Neutrophils Relative %: 69 %
PLATELETS: 242 10*3/uL (ref 150–440)
RBC: 4.2 MIL/uL — ABNORMAL LOW (ref 4.40–5.90)
RDW: 12.8 % (ref 11.5–14.5)
WBC: 8.9 10*3/uL (ref 3.8–10.6)

## 2015-03-24 LAB — BASIC METABOLIC PANEL
Anion gap: 7 (ref 5–15)
BUN: 41 mg/dL — AB (ref 6–20)
CO2: 25 mmol/L (ref 22–32)
Calcium: 8.8 mg/dL — ABNORMAL LOW (ref 8.9–10.3)
Chloride: 106 mmol/L (ref 101–111)
Creatinine, Ser: 1.45 mg/dL — ABNORMAL HIGH (ref 0.61–1.24)
GFR calc Af Amer: 50 mL/min — ABNORMAL LOW (ref >60)
GFR, EST NON AFRICAN AMERICAN: 43 mL/min — AB (ref >60)
GLUCOSE: 118 mg/dL — AB (ref 65–99)
POTASSIUM: 3.9 mmol/L (ref 3.5–5.1)
Sodium: 138 mmol/L (ref 135–145)

## 2015-03-24 MED ORDER — FUROSEMIDE 40 MG PO TABS: 40.0000 mg | ORAL_TABLET | Freq: Every day | ORAL | Status: AC

## 2015-03-24 NOTE — Care Management (Signed)
For discharge today.  No needs

## 2015-03-24 NOTE — Progress Notes (Signed)
Subjective:   shortness of breath  leg edema  Weakness fatigued denies chest pain  Objective:  Vital Signs in the last 24 hours: Temp:  [98 F (36.7 C)-98.5 F (36.9 C)] 98 F (36.7 C) (07/06 0511) Pulse Rate:  [108-109] 108 (07/06 0511) Resp:  [18] 18 (07/06 0511) BP: (109-121)/(82-84) 109/84 mmHg (07/06 0511) SpO2:  [91 %-92 %] 91 % (07/06 0511) Weight:  [73.619 kg (162 lb 4.8 oz)] 73.619 kg (162 lb 4.8 oz) (07/06 0511)  Intake/Output from previous day: 07/05 0701 - 07/06 0700 In: 720 [P.O.:720] Out: 200 [Urine:200] Intake/Output from this shift: Total I/O In: 240 [P.O.:240] Out: -   Physical Exam: General appearance: cooperative, appears older than stated age and mild distress Neck: no adenopathy, no carotid bruit, no JVD, supple, symmetrical, trachea midline and thyroid not enlarged, symmetric, no tenderness/mass/nodules Lungs: diminished breath sounds bibasilar and rhonchi bibasilar and bilaterally Heart: irregularly irregular rhythm Abdomen: soft, non-tender; bowel sounds normal; no masses,  no organomegaly Extremities: edema 2+ leg edema Pulses: 2+ and symmetric Skin: Skin color, texture, turgor normal. No rashes or lesions Neurologic: Alert and oriented X 3, normal strength and tone. Normal symmetric reflexes. Normal coordination and gait  Lab Results:  Recent Labs  03/22/15 0900 03/24/15 0359  WBC 9.5 8.9  HGB 13.8 13.8  PLT 236 242    Recent Labs  03/23/15 0352 03/24/15 0359  NA 140 138  K 3.8 3.9  CL 103 106  CO2 27 25  GLUCOSE 125* 118*  BUN 30* 41*  CREATININE 1.54* 1.45*    Recent Labs  03/22/15 1519 03/22/15 2108  TROPONINI <0.03 <0.03   Hepatic Function Panel  Recent Labs  03/22/15 0900  PROT 7.1  ALBUMIN 3.7  AST 19  ALT 15*  ALKPHOS 78  BILITOT 0.5   No results for input(s): CHOL in the last 72 hours. No results for input(s): PROTIME in the last 72 hours.  Imaging: Imaging results have been reviewed  Cardiac  Studies:  Assessment/Plan:  Atrial Fibrillation CHF Coronary Artery Disease Edema Shortness of Breath   GERD  respiratory failure  hypertension   Acute on chronic systolic heart failure  cardiomyopathy systolic dysfunction . PLAN  continue supplemental oxygen  Lasix intravenously for shortness  of breath  continue Eliquis for anticoagulation for AFib  continue amiodarone for rhythm control  GERD therapy continue omeprazole  continue current therapy for coronary disease  agree with echocardiogram for assessment of heart failure  increase activity  do not recommend cardiac catheterization at this states  salt restriction  outpatient follow-up with Cardiology as an outpatient    LOS: 2 days    [name] D. 03/24/2015, 2:30 PM

## 2015-03-24 NOTE — Plan of Care (Signed)
Problem: Discharge Progression Outcomes Goal: Other Discharge Outcomes/Goals Outcome: Completed/Met Date Met:  03/24/15 Pt remains alert and oriented x 4, up to bathroom with stand by assist, denies dyspnea on exertion, denies pain, minimal edema, vital signs remain stable, pt is d/c to home via family, d/c instruction reviewed with patients, pt educated on signs/ symptoms of worsening chf, rx of lasix provided to patient, pt to f/u with [place] in one week for lab work, pt reports understanding of d/c instructions. Pt pushed to visitor entrance via nursing staff. No sob experienced at this time.

## 2015-03-24 NOTE — Care Management (Signed)
Important Message  Patient Details  Name: [name] MRN: [id number] Date of Birth: [birth year]   Medicare Important Message Given:  Yes-second notification given    Olegario MessierKathy A Allmond 03/24/2015, 9:45 AM

## 2015-03-24 NOTE — Plan of Care (Signed)
Problem: Phase III Progression Outcomes Goal: Fluid volume status improved Patient is stable overnight. He demonstrated increase activity level: he was able to transfer to and from the bathroom w/o c/o of SOB.VS remained WDL for patient. Will continue to monitor

## 2015-03-24 NOTE — Discharge Summary (Signed)
Physician Discharge Summary  [name] YNW:[id number] DOB: September 21, 1930 DOA: 03/22/2015  PCP: Barbette Reichmann, MD  Admit date: 03/22/2015 Discharge date: 03/24/2015  Time spent: 35  minutes  Recommendations for Outpatient Follow-up:  1. Follow up with Dr. Marcello Fennel. 2. Follow up with [facility]- cardiologist  Discharge Diagnoses:  1 Acute on Chronic Systolic CHF 2 Paroxysmal A-Fib 3 Hx of CAD   Discharge Condition: Stable  Diet recommendation: 2 gm sodium  There were no vitals filed for this visit.  History of present illness:  [name] is an 81 year old male with a history of CAD. Hypertension, Paroxysmal  Atrial fibrillation, Chronic systolic CHF-presented to the ED complaining of shortness of breath that got worse about for about 4 days.Patient also reports palpitations and a dry cough. Patient's O2 sat was less than 85% on room air and in the ED and patient needed 3 L nasal cannula oxygen  Hospital Course:  He was admitted [facility] and received initially intravenous Lasix and was subsequently switched to by mouth Lasix. He was also seen in consultation by cardiologist Dr. Shade Flood Patient also an echocardiogram showed EF of approximately 25% During stay in hospital patient gradually improved shortness of breath was less he was ambulated and he was discharged stable condition and advised follow-up in the clinic with me Dr. Marcello Fennel  also follow his cardiologist Dr. Gwen Pounds    Consultations: Cardiologist- Dr. Juliann Pares   Discharge Exam: There were no vitals filed for this visit.  General: Not in distress Cardiovascular: Irregular. S1 S2 Respiratory: Clear to auscultaion  Discharge Instructions    Cannot display discharge medications since this is not an admission.  Allergies  Allergen Reactions  . Lipitor [Atorvastatin]     weakness      The results of significant diagnostics from this hospitalization (including imaging,  microbiology, ancillary and laboratory) are listed below for reference.    Significant Diagnostic Studies: Dg Chest Port 1 View  03/22/2015   CLINICAL DATA:  Elevated blood pressure heart rate, shortness of breath, history hypertension, atrial fibrillation, prior MI  EXAM: PORTABLE CHEST - 1 VIEW  COMPARISON:  Portable exam 0847 hours compared to 03/26/2014  FINDINGS: Enlargement of cardiac silhouette with pulmonary vascular congestion.  Perihilar and basilar infiltrates favoring mild pulmonary edema and CHF.  Small bibasilar pleural effusions and minimal RIGHT basilar atelectasis.  No pneumothorax.  Bones unremarkable.  IMPRESSION: Probable mild CHF.   Electronically Signed   By: Ulyses Southward M.D.   On: 03/22/2015 09:09    Microbiology: No results found for this or any previous visit (from the past 240 hour(s)).   Labs: Basic Metabolic Panel:  Recent Labs Lab 03/22/15 0900 03/23/15 0352 03/24/15 0359  NA 139 140 138  K 3.9 3.8 3.9  CL 107 103 106  CO2 GLUCOSE 127* 125* 118*  BUN 25* 30* 41*  CREATININE 1.13 1.54* 1.45*  CALCIUM 9.0 9.2 8.8*   Liver Function Tests:  Recent Labs Lab 03/22/15 0900  AST 19  ALT 15*  ALKPHOS 78  BILITOT 0.5  PROT 7.1  ALBUMIN 3.7   No results for input(s): LIPASE, AMYLASE in the last 168 hours. No results for input(s): AMMONIA in the last 168 hours. CBC:  Recent Labs Lab 03/22/15 0900 03/24/15 0359  WBC 9.5 8.9  NEUTROABS  --  6.2  HGB 13.8 13.8  HCT 41.5 40.8  MCV 98.3 97.1  PLT 236 242   Cardiac Enzymes:  Recent Labs Lab 03/22/15  0900 03/22/15 1519 03/22/15 2108  TROPONINI <0.03 <0.03 <0.03   BNP: BNP (last 3 results)  Recent Labs  03/22/15 0900  BNP 574.0*    ProBNP (last 3 results) No results for input(s): PROBNP in the last 8760 hours.  CBG: No results for input(s): GLUCAP in the last 168 hours.     SignedBarbette Reichmann:  [name]   03/24/2015, 12:22 PM

## 2015-03-24 NOTE — Progress Notes (Signed)
   PROGRESS NOTE  [name] [id number] DOB: 1930-10-01 DOA: 03/22/2015 PCP: Barbette ReichmannHANDE,Joseph Hornback, MD  Subjective:   81 y/o male with hx of CAD, HTN, paroxysmal A-fib Systolic CHF admitted with progressive shortness of breath and orthopnea. ECHO : EF 25% Consultants: .Cardiology: Dr.Calwood   Objective: BP 109/84 mmHg  Pulse 108  Temp(Src) 98 F (36.7 C) (Oral)  Resp 18  Ht 5\' 10"  (1.778 m)  Wt 73.619 kg (162 lb 4.8 oz)  BMI 23.29 kg/m2  SpO2 91%  Intake/Output Summary (Last 24 hours) at 03/24/15 0818 Last data filed at 03/24/15 0204  Gross per 24 hour  Intake    720 ml  Output    200 ml  Net    520 ml   Filed Weights   03/22/15 1237 03/23/15 0509 03/24/15 0511  Weight: 73.483 kg (162 lb) 72.802 kg (160 lb 8 oz) 73.619 kg (162 lb 4.8 oz)    Exam: [place] AT  General:  Not in distress  Cardiovascular:S1 S2  Respiratory: Clear to auscultation  Abdomen: Soft. Non tender  Neuro:Non Focal   Data Reviewed: Basic Metabolic Panel:  Recent Labs Lab 03/22/15 0900 03/23/15 0352 03/24/15 0359  NA 139 140 138  K 3.9 3.8 3.9  CL 107 103 106  CO2 23 27 25   GLUCOSE 127* 125* 118*  BUN 25* 30* 41*  CREATININE 1.13 1.54* 1.45*  CALCIUM 9.0 9.2 8.8*   Liver Function Tests:  Recent Labs Lab 03/22/15 0900  AST 19  ALT 15*  ALKPHOS 78  BILITOT 0.5  PROT 7.1  ALBUMIN 3.7   No results for input(s): LIPASE, AMYLASE in the last 168 hours. No results for input(s): AMMONIA in the last 168 hours. CBC:  Recent Labs Lab 03/22/15 0900 03/24/15 0359  WBC 9.5 8.9  NEUTROABS  --  6.2  HGB 13.8 13.8  HCT 41.5 40.8  MCV 98.3 97.1  PLT 236 242   Cardiac Enzymes:    Recent Labs Lab 03/22/15 0900 03/22/15 1519 03/22/15 2108  TROPONINI <0.03 <0.03 <0.03   BNP (last 3 results)  Recent Labs  03/22/15 0900  BNP 574.0*    ProBNP (last 3 results) No results for input(s): PROBNP in the last 8760 hours.  CBG: No results for input(s): GLUCAP in the last  168 hours.  No results found for this or any previous visit (from the past 240 hour(s)).   Studies: No results found.  Scheduled Meds: . amiodarone  400 mg Oral Daily  . apixaban  5 mg Oral BID  . docusate sodium  100 mg Oral BID  . furosemide  40 mg Oral Daily  . metoprolol  100 mg Oral BID  . pantoprazole  40 mg Oral Daily  . sodium chloride  3 mL Intravenous Q12H  . vitamin E  400 Units Oral Daily    Continuous Infusions:   Assessment/Plan:  1  Acute on Chronic CHF (congestive heart failure) On Lasix 40 mg po qd  Will start ACEI once renal function is back to baseline  Change to po lasix given elevated BUN and Creat  2  Acute respiratory failure; Doing better- O2 sats 96%  3 HTN (hypertension):Continue Metoprolol 4 Paroxysmal; A-fib: On Eliquis and Amiodarone  D/c home later today   Code Status: Full  Family Communication: son      [facility] CenterANDE,Ebunoluwa Gernert   03/24/2015, 8:18 AM  LOS: 2 days

## 2015-04-14 ENCOUNTER — Observation Stay (HOSPITAL_COMMUNITY)
Admission: EM | Admit: 2015-04-14 | Discharge: 2015-04-15 | Disposition: A | Discharge status: Home / Self Care | Payer: PPO | Attending: Internal Medicine | Admitting: Internal Medicine

## 2015-04-14 ENCOUNTER — Emergency Department (HOSPITAL_COMMUNITY): Payer: PPO

## 2015-04-14 ENCOUNTER — Encounter (HOSPITAL_COMMUNITY): Payer: Self-pay | Admitting: *Deleted

## 2015-04-14 DIAGNOSIS — K219 Gastro-esophageal reflux disease without esophagitis: Secondary | ICD-10-CM | POA: Diagnosis not present

## 2015-04-14 DIAGNOSIS — I482 Chronic atrial fibrillation, unspecified: Secondary | ICD-10-CM

## 2015-04-14 DIAGNOSIS — Z79899 Other long term (current) drug therapy: Secondary | ICD-10-CM | POA: Diagnosis not present

## 2015-04-14 DIAGNOSIS — R55 Syncope and collapse: Principal | ICD-10-CM | POA: Diagnosis present

## 2015-04-14 DIAGNOSIS — Z955 Presence of coronary angioplasty implant and graft: Secondary | ICD-10-CM | POA: Insufficient documentation

## 2015-04-14 DIAGNOSIS — E785 Hyperlipidemia, unspecified: Secondary | ICD-10-CM | POA: Insufficient documentation

## 2015-04-14 DIAGNOSIS — Z87891 Personal history of nicotine dependence: Secondary | ICD-10-CM | POA: Insufficient documentation

## 2015-04-14 DIAGNOSIS — I10 Essential (primary) hypertension: Secondary | ICD-10-CM | POA: Diagnosis not present

## 2015-04-14 DIAGNOSIS — I252 Old myocardial infarction: Secondary | ICD-10-CM | POA: Diagnosis not present

## 2015-04-14 DIAGNOSIS — I5022 Chronic systolic (congestive) heart failure: Secondary | ICD-10-CM | POA: Diagnosis not present

## 2015-04-14 DIAGNOSIS — Z7901 Long term (current) use of anticoagulants: Secondary | ICD-10-CM | POA: Insufficient documentation

## 2015-04-14 DIAGNOSIS — I2510 Atherosclerotic heart disease of native coronary artery without angina pectoris: Secondary | ICD-10-CM | POA: Diagnosis not present

## 2015-04-14 DIAGNOSIS — I4891 Unspecified atrial fibrillation: Secondary | ICD-10-CM | POA: Diagnosis present

## 2015-04-14 DIAGNOSIS — I509 Heart failure, unspecified: Secondary | ICD-10-CM

## 2015-04-14 LAB — CBC WITH DIFFERENTIAL/PLATELET
BASOS ABS: 0 10*3/uL (ref 0.0–0.1)
BASOS PCT: 0 % (ref 0–1)
Eosinophils Absolute: 0.2 10*3/uL (ref 0.0–0.7)
Eosinophils Relative: 3 % (ref 0–5)
HEMATOCRIT: 37.2 % — AB (ref 39.0–52.0)
Hemoglobin: 12.4 g/dL — ABNORMAL LOW (ref 13.0–17.0)
LYMPHS PCT: 19 % (ref 12–46)
Lymphs Abs: 1.3 10*3/uL (ref 0.7–4.0)
MCH: 32.7 pg (ref 26.0–34.0)
MCHC: 33.3 g/dL (ref 30.0–36.0)
MCV: 98.2 fL (ref 78.0–100.0)
Monocytes Absolute: 0.7 10*3/uL (ref 0.1–1.0)
Monocytes Relative: 10 % (ref 3–12)
Neutro Abs: 4.8 10*3/uL (ref 1.7–7.7)
Neutrophils Relative %: 68 % (ref 43–77)
Platelets: 185 10*3/uL (ref 150–400)
RBC: 3.79 MIL/uL — ABNORMAL LOW (ref 4.22–5.81)
RDW: 13.3 % (ref 11.5–15.5)
WBC: 7.1 10*3/uL (ref 4.0–10.5)

## 2015-04-14 LAB — MAGNESIUM: Magnesium: 1.8 mg/dL (ref 1.7–2.4)

## 2015-04-14 LAB — BASIC METABOLIC PANEL
Anion gap: 10 (ref 5–15)
BUN: 27 mg/dL — ABNORMAL HIGH (ref 6–20)
CO2: 24 mmol/L (ref 22–32)
CREATININE: 1.54 mg/dL — AB (ref 0.61–1.24)
Calcium: 8.4 mg/dL — ABNORMAL LOW (ref 8.9–10.3)
Chloride: 105 mmol/L (ref 101–111)
GFR calc Af Amer: 47 mL/min — ABNORMAL LOW (ref >60)
GFR calc non Af Amer: 40 mL/min — ABNORMAL LOW (ref >60)
GLUCOSE: 139 mg/dL — AB (ref 65–99)
Potassium: 3.8 mmol/L (ref 3.5–5.1)
Sodium: 139 mmol/L (ref 135–145)

## 2015-04-14 LAB — I-STAT TROPONIN, ED: TROPONIN I, POC: 0 ng/mL (ref 0.00–0.08)

## 2015-04-14 NOTE — ED Provider Notes (Signed)
CSN: [id number]     Arrival date & time 04/14/15  1901 History   First MD Initiated Contact with Patient 04/14/15 1907     Chief Complaint  Patient presents with  . Loss of Consciousness     (Consider location/radiation/quality/duration/timing/severity/associated sxs/prior Treatment) Patient is a 81 y.o. male presenting with syncope. The history is provided by the patient and medical records. No language interpreter was used.  Loss of Consciousness Associated symptoms: no chest pain, no diaphoresis, no fever, no headaches, no nausea, no shortness of breath and no vomiting      [name] is a 81 y.o. male  with a hx of HTN, Afib, MI, CAD with stent, GERD, Chronic CHF, a-fib (with scheduled cardioversion in 2 days) presents to the Emergency Department complaining of sudden onset syncope without prodrome onset just prior to arrival.  Pt denies CP, SOB.  He is sweaty as if he was diaphoretic but denies this.  He does not remember the event.   No known aggravating or alleviating symptoms.  Pt is anticoagulated on Eliquis.  Pt denies fever, chills, headache, neck pain, chest pain, SOB, abd pain, N/V/D, dysuria, hematuria.   Family at bedside reports that pt was pale and apneic immediately after the syncope.  No CPR required by EMS as pt was not pulseless or apneic on their arrival.    Cardiologist: Dr. Gwen Pounds [facility]   Past Medical History  Diagnosis Date  . Hypertension   . Atrial fibrillation   . MI, old   . CAD (coronary artery disease)   . Pancreatitis   . GERD (gastroesophageal reflux disease)   . Anemia   . Chronic systolic CHF (congestive heart failure)   . Hyperlipemia    Past Surgical History  Procedure Laterality Date  . Cardiac catheterization     Family History  Problem Relation Age of Onset  . Hypertension Father   . Hyperlipidemia Father    History  Substance Use Topics  . Smoking status: Former Games developer  . Smokeless tobacco: Not on file  . Alcohol  Use: No    Review of Systems  Constitutional: Negative for fever, diaphoresis, appetite change, fatigue and unexpected weight change.  HENT: Negative for mouth sores.   Eyes: Negative for visual disturbance.  Respiratory: Negative for cough, chest tightness, shortness of breath and wheezing.   Cardiovascular: Positive for syncope. Negative for chest pain.  Gastrointestinal: Negative for nausea, vomiting, abdominal pain, diarrhea and constipation.  Endocrine: Negative for polydipsia, polyphagia and polyuria.  Genitourinary: Negative for dysuria, urgency, frequency and hematuria.  Musculoskeletal: Negative for back pain and neck stiffness.  Skin: Negative for rash.  Allergic/Immunologic: Negative for immunocompromised state.  Neurological: Positive for syncope. Negative for light-headedness and headaches.  Hematological: Does not bruise/bleed easily.  Psychiatric/Behavioral: Negative for sleep disturbance. The patient is not nervous/anxious.       Allergies  Lipitor  Home Medications   Prior to Admission medications   Medication Sig Start Date End Date Taking? Authorizing Provider  ELIQUIS 5 MG TABS tablet Take 5 mg by mouth 2 (two) times daily. 02/27/15  Yes Historical Provider, MD  metoprolol tartrate (LOPRESSOR) 25 MG tablet Take 75 mg by mouth 2 (two) times daily.   Yes Historical Provider, MD  Multiple Vitamin (MULTI-VITAMINS) TABS Take 1 tablet by mouth daily.   Yes Historical Provider, MD  nitroGLYCERIN (NITROSTAT) 0.4 MG SL tablet Place 0.4 mg under the tongue every 5 (five) minutes x 3 doses as needed. For chest  pain. If no relief call MD or go to emergency room.   Yes Historical Provider, MD  vitamin E 400 UNIT capsule Take 400 Units by mouth daily.   Yes Historical Provider, MD  furosemide (LASIX) 40 MG tablet Take 1 tablet (40 mg total) by mouth daily. Patient not taking: Reported on 04/14/2015 03/24/15   Barbette Reichmann, MD   BP 135/77 mmHg  Pulse 96  Temp(Src) 97.5 F  (36.4 C) (Oral)  Resp 26  Ht  (1.778 m)  Wt 162 lb (73.483 kg)  BMI 23.24 kg/m2  SpO2 97% Physical Exam  Constitutional: He appears well-developed and well-nourished. No distress.  Awake, alert, nontoxic appearance  HENT:  Head: Normocephalic and atraumatic.  Mouth/Throat: Oropharynx is clear and moist. No oropharyngeal exudate.  Eyes: Conjunctivae are normal. No scleral icterus.  Neck: Normal range of motion. Neck supple.  Cardiovascular: Normal rate, normal heart sounds and intact distal pulses.  An irregularly irregular rhythm present.  Pulses:      Radial pulses are 2+ on the right side, and 2+ on the left side.       Dorsalis pedis pulses are 2+ on the right side, and 2+ on the left side.       Posterior tibial pulses are 2+ on the right side, and 2+ on the left side.  Pulmonary/Chest: Effort normal and breath sounds normal. No respiratory distress. He has no wheezes.  Equal chest expansion  Abdominal: Soft. Bowel sounds are normal. He exhibits no mass. There is no tenderness. There is no rebound and no guarding.  Musculoskeletal: Normal range of motion. He exhibits no edema.  Neurological: He is alert.  Speech is clear and goal oriented Moves extremities without ataxia  Skin: Skin is warm and dry. He is not diaphoretic. No erythema.  No rash No erythema  Psychiatric: He has a normal mood and affect.  Nursing note and vitals reviewed.   ED Course  Procedures (including critical care time) Labs Review Labs Reviewed  CBC WITH DIFFERENTIAL/PLATELET - Abnormal; Notable for the following:    RBC 3.79 (*)    Hemoglobin 12.4 (*)    HCT 37.2 (*)    All other components within normal limits  BASIC METABOLIC PANEL - Abnormal; Notable for the following:    Glucose, Bld 139 (*)    BUN 27 (*)    Creatinine, Ser 1.54 (*)    Calcium 8.4 (*)    GFR calc non Af Amer 40 (*)    GFR calc Af Amer 47 (*)    All other components within normal limits  MAGNESIUM  I-STAT  TROPOININ, ED    Imaging Review Dg Chest 2 View  04/14/2015   CLINICAL DATA:  Syncope with fall earlier today  EXAM: CHEST  2 VIEW  COMPARISON:  March 22, 2015  FINDINGS: There is an apparent nipple shadow on the right. There is no edema or consolidation. The heart is upper normal in size with pulmonary vascularity within normal limits. No adenopathy. There is degenerative change in the thoracic spine. There is an azygos lobe on the right, an anatomic variant.  IMPRESSION: No edema or consolidation.   Electronically Signed   By: Bretta Bang III M.D.   On: 04/14/2015 19:52     EKG Interpretation   Date/Time:  Wednesday April 14 2015 19:10:37 EDT Ventricular Rate:  81 PR Interval:    QRS Duration: 173 QT Interval:  451 QTC Calculation: 524 R Axis:   14 Text Interpretation:  Atrial flutter Paired ventricular premature  complexes Left bundle branch block No significant change was found  Confirmed by CAMPOS  MD, Caryn Bee ([id number]) on 04/14/2015 7:13:53 PM      MDM   Final diagnoses:  Syncope  Chronic atrial fibrillation  Essential hypertension  Chronic anticoagulation   Maryruth Bun presents with Hx of CAD with stent, a-fib and syncope without prodrome.  Will needs admission for observation as he is high risk for arrhythmia.   Labs reassuring; serum creatinine at baseline. Patient to be admitted.  9:56 PM Discussed with Internal Medicine who will admit.    BP 135/77 mmHg  Pulse 96  Temp(Src) 97.5 F (36.4 C) (Oral)  Resp 26  Ht 5\' 10"  (1.778 m)  Wt 162 lb (73.483 kg)  BMI 23.24 kg/m2  SpO2 97%   The patient was discussed with and seen by Dr. Patria Mane who agrees with the treatment plan.   Dahlia Client Midge Momon, PA-C 04/14/15 1914  Azalia Bilis, MD 04/14/15 2213

## 2015-04-14 NOTE — ED Notes (Signed)
Per EMS- pt at home with family when he had a syncopal episode and fell onto the floor. EKG read stemi however looks like bundle branch. Elevated in two non-consecutive leads with a Q wave present. Pt has 25% heart function. Denies CP. CBG 177. SPo2 97% RA BP 138/88. PIV 18G placed to LAC, no meds given. Pt does not remember what happened. Pt is alert and oriented.

## 2015-04-14 NOTE — ED Notes (Signed)
PA at bedside.

## 2015-04-14 NOTE — H&P (Signed)
[facility] History and Physical  [name] ZOX:[id number] DOB: 10/12/1930 DOA: 04/14/2015  Referring physician: Dr. Mardene Sayer,  PCP: Barbette Reichmann, MD   Chief Complaint: I passed out.  HPI: [name] is a 81 y.o. male with below past medical history who was brought to the emergency department via EMS after he collapsed and passed out at home after dinner. He does not remember how things happened, he denies chest pain, palpitations, dyspnea, diaphoresis, nausea or blurred vision. His son states the it took him about 10 minutes to start responding and he was not fully awake until EMS was bringing him to the hospital.   He states that recently, at times, he has been mildly lightheaded which is usually self-limited and positional, but no symptoms like this evening. He had a similar syncope episode about 3-4 years ago, which he thinks was due to a strong ammonia fumes. Patient recently was diagnosed with atrial flutter/fibrillation which failed to convert medically and he is to schedule for electrical cardioversion on Monday with cardiology.  He is currently in no acute distress and denies any complaints.  Review of Systems:  Constitutional:  No weight loss, night sweats, Fevers, chills, fatigue.  HEENT:  Mild occasional headaches,  Difficulty swallowing,Tooth/dental problems,Sore throat,  No sneezing, itching, ear ache, nasal congestion, post nasal drip,  Cardio-vascular:  No chest pain, Orthopnea, PND, swelling in lower extremities, anasarca, , palpitations Mild dizziness  GI:  No heartburn, indigestion, abdominal pain, nausea, vomiting, diarrhea, change in bowel habits, loss of appetite  Resp:  Frequent shortness of breath with exertion. He results with rest.  No excess mucus, no productive cough, No non-productive cough, No coughing up of blood.No change in color of mucus.No wheezing.No chest wall deformity  Skin:  no rash or lesions.  GU:  no dysuria,  change in color of urine, no urgency or frequency. No flank pain.  Musculoskeletal:  No joint pain or swelling. No decreased range of motion. No back pain.  Psych:  No change in mood or affect. No depression or anxiety. No memory loss.   Past Medical History  Diagnosis Date  . Hypertension   . Atrial fibrillation   . MI, old   . CAD (coronary artery disease)   . Pancreatitis   . GERD (gastroesophageal reflux disease)   . Anemia   . Chronic systolic CHF (congestive heart failure)   . Hyperlipemia    Past Surgical History  Procedure Laterality Date  . Cardiac catheterization     Social History:  reports that he has quit smoking. He does not have any smokeless tobacco history on file. He reports that he does not drink alcohol or use illicit drugs.  Allergies  Allergen Reactions  . Lipitor [Atorvastatin]     weakness    Family History  Problem Relation Age of Onset  . Hypertension Father   . Hyperlipidemia Father      Prior to Admission medications   Medication Sig Start Date End Date Taking? Authorizing Provider  ELIQUIS 5 MG TABS tablet Take 5 mg by mouth 2 (two) times daily. 02/27/15  Yes Historical Provider, MD  metoprolol tartrate (LOPRESSOR) 25 MG tablet Take 75 mg by mouth 2 (two) times daily.   Yes Historical Provider, MD  Multiple Vitamin (MULTI-VITAMINS) TABS Take 1 tablet by mouth daily.   Yes Historical Provider, MD  nitroGLYCERIN (NITROSTAT) 0.4 MG SL tablet Place 0.4 mg under the tongue every 5 (five) minutes x 3 doses as needed. For chest  pain. If no relief call MD or go to emergency room.   Yes Historical Provider, MD  vitamin E 400 UNIT capsule Take 400 Units by mouth daily.   Yes Historical Provider, MD  furosemide (LASIX) 40 MG tablet Take 1 tablet (40 mg total) by mouth daily. Patient not taking: Reported on 04/14/2015 03/24/15   Barbette Reichmann, MD   Physical Exam: Filed Vitals:   04/14/15 2115 04/14/15 2130 04/14/15 2200 04/14/15 2230  BP: 126/78 135/77  117/77 123/78  Pulse: 89 96 57 94  Temp:      TempSrc:      Resp: Height:      Weight:      SpO2: 96% 97% 97% 96%    Wt Readings from Last 3 Encounters:  04/14/15 73.483 kg (162 lb)  03/24/15 73.619 kg (162 lb 4.8 oz)    General:  Appears calm and comfortable Eyes: PERRL, normal lids, irises & conjunctiva ENT: grossly normal hearing, lips & tongue mildly dry. Neck: no LAD, masses or thyromegaly Cardiovascular: Irregularly irregular. No LE edema. Telemetry: SR, atrial fibrillation  Respiratory: CTA bilaterally, no w/r/r. Normal respiratory effort. Abdomen: soft, ntnd Skin: no rash or induration seen on limited exam Musculoskeletal: grossly normal tone BUE/BLE Psychiatric: grossly normal mood and affect, speech fluent and appropriate Neurologic: grossly non-focal.          Labs on Admission:  Basic Metabolic Panel:  Recent Labs Lab 04/14/15 1922  NA 139  K 3.8  CL 105  CO2 24  GLUCOSE 139  BUN 27  CREATININE 1.54  CALCIUM 8.4  MG 1.8   Liver Function Tests: No results for input(s): AST, ALT, ALKPHOS, BILITOT, PROT, ALBUMIN in the last 168 hours. No results for input(s): LIPASE, AMYLASE in the last 168 hours. No results for input(s): AMMONIA in the last 168 hours. CBC:  Recent Labs Lab 04/14/15 1922  WBC 7.1  NEUTROABS 4.8  HGB 12.4  HCT 37.2  MCV 98.2  PLT 185   Cardiac Enzymes: No results for input(s): CKTOTAL, CKMB, CKMBINDEX, TROPONINI in the last 168 hours.  BNP (last 3 results)  Recent Labs  03/22/15 0900  BNP 574.0    ProBNP (last 3 results) No results for input(s): PROBNP in the last 8760 hours.  CBG: No results for input(s): GLUCAP in the last 168 hours.  Radiological Exams on Admission: Dg Chest 2 View  04/14/2015   CLINICAL DATA:  Syncope with fall earlier today  EXAM: CHEST  2 VIEW  COMPARISON:  March 22, 2015  FINDINGS: There is an apparent nipple shadow on the right. There is no edema or consolidation. The heart is  upper normal in size with pulmonary vascularity within normal limits. No adenopathy. There is degenerative change in the thoracic spine. There is an azygos lobe on the right, an anatomic variant.  IMPRESSION: No edema or consolidation.   Electronically Signed   By: Bretta Bang III M.D.   On: 04/14/2015 19:52    EKG: Independently reviewed. Vent. rate 81 BPM PR interval * ms QRS duration 173 ms QT/QTc 451/524 ms P-R-T axes -1 14 64 Atrial flutter Paired ventricular premature complexes Left bundle branch block  Assessment/Plan Principal Problem:   Syncope and collapse (Suspect mild dehydration in combination with antihypertensives) Active Problems:   CHF (congestive heart failure)   HTN (hypertension)   Atrial fibrillation   1. Admitted for telemetry monitoring, serial troponin levels trending. Decrease metoprolol to 50 mg by mouth twice  a day. I advised the patient and his sons, that if a higher dose of metoprolol is needed to use 50 mg 3 times a day instead of 75 mg twice a day. 2. Compensated CHF. Continue current maintenance therapy.  3. Continue current antihypertensives and monitor blood pressure while the patient is in the hospital. 4. The patient is scheduled for cardioversion on Monday. Continue anticoagulation.  Code Status: Full code. DVT Prophylaxis: The patient is on chronic anticoagulation therapy. Family Communication:  Jett, Fukuda Spouse [phone]    Lonzie, Simmer [phone]    Disposition Plan: Home with cardiology follow-up as per schedule  Time spent: 60 minutes.  Bobette Mo [facility] Pager [phone].

## 2015-04-15 DIAGNOSIS — I252 Old myocardial infarction: Secondary | ICD-10-CM | POA: Diagnosis not present

## 2015-04-15 DIAGNOSIS — I482 Chronic atrial fibrillation: Secondary | ICD-10-CM | POA: Diagnosis not present

## 2015-04-15 DIAGNOSIS — R55 Syncope and collapse: Secondary | ICD-10-CM

## 2015-04-15 DIAGNOSIS — I10 Essential (primary) hypertension: Secondary | ICD-10-CM | POA: Diagnosis not present

## 2015-04-15 LAB — BASIC METABOLIC PANEL
ANION GAP: 9 (ref 5–15)
BUN: 21 mg/dL — ABNORMAL HIGH (ref 6–20)
CALCIUM: 9 mg/dL (ref 8.9–10.3)
CHLORIDE: 103 mmol/L (ref 101–111)
CO2: 27 mmol/L (ref 22–32)
Creatinine, Ser: 1.25 mg/dL — ABNORMAL HIGH (ref 0.61–1.24)
GFR calc Af Amer: >60 mL/min (ref >60)
GFR calc non Af Amer: 52 mL/min — ABNORMAL LOW (ref >60)
Glucose, Bld: 145 mg/dL — ABNORMAL HIGH (ref 65–99)
POTASSIUM: 3.4 mmol/L — AB (ref 3.5–5.1)
Sodium: 139 mmol/L (ref 135–145)

## 2015-04-15 LAB — TROPONIN I
Troponin I: <0.03 ng/mL (ref <0.031)
Troponin I: <0.03 ng/mL (ref <0.031)
Troponin I: <0.03 ng/mL (ref <0.031)

## 2015-04-15 LAB — CBC
HCT: 40.4 % (ref 39.0–52.0)
Hemoglobin: 13.3 g/dL (ref 13.0–17.0)
MCH: 32.8 pg (ref 26.0–34.0)
MCHC: 32.9 g/dL (ref 30.0–36.0)
MCV: 99.5 fL (ref 78.0–100.0)
Platelets: 190 10*3/uL (ref 150–400)
RBC: 4.06 MIL/uL — ABNORMAL LOW (ref 4.22–5.81)
RDW: 13.3 % (ref 11.5–15.5)
WBC: 7.5 10*3/uL (ref 4.0–10.5)

## 2015-04-15 MED ORDER — POTASSIUM CHLORIDE CRYS ER 20 MEQ PO TBCR
40.0000 meq | EXTENDED_RELEASE_TABLET | Freq: Once | ORAL | Status: AC
  Administered 2015-04-15: 40 meq via ORAL
  Filled 2015-04-15: qty 2

## 2015-04-15 MED ORDER — SODIUM CHLORIDE 0.9 % IJ SOLN
3.0000 mL | Freq: Two times a day (BID) | INTRAMUSCULAR | Status: DC
  Administered 2015-04-15 (×2): 3 mL via INTRAVENOUS

## 2015-04-15 MED ORDER — FUROSEMIDE 40 MG PO TABS
40.0000 mg | ORAL_TABLET | Freq: Every day | ORAL | Status: DC
  Administered 2015-04-15: 40 mg via ORAL
  Filled 2015-04-15: qty 1

## 2015-04-15 MED ORDER — MAGNESIUM SULFATE 2 GM/50ML IV SOLN
2.0000 g | Freq: Once | INTRAVENOUS | Status: AC
  Administered 2015-04-15: 2 g via INTRAVENOUS
  Filled 2015-04-15: qty 50

## 2015-04-15 MED ORDER — METOPROLOL TARTRATE 50 MG PO TABS
50.0000 mg | ORAL_TABLET | Freq: Two times a day (BID) | ORAL | Status: DC
  Administered 2015-04-15 (×2): 50 mg via ORAL
  Filled 2015-04-15 (×3): qty 1

## 2015-04-15 MED ORDER — AMIODARONE HCL 200 MG PO TABS
400.0000 mg | ORAL_TABLET | Freq: Every day | ORAL | Status: DC
  Administered 2015-04-15: 400 mg via ORAL
  Filled 2015-04-15: qty 2

## 2015-04-15 MED ORDER — ONDANSETRON HCL 4 MG/2ML IJ SOLN: 4.0000 mg | Freq: Four times a day (QID) | INTRAMUSCULAR | Status: DC | PRN

## 2015-04-15 MED ORDER — ACETAMINOPHEN 325 MG PO TABS: 650.0000 mg | ORAL_TABLET | ORAL | Status: DC | PRN

## 2015-04-15 MED ORDER — METOPROLOL TARTRATE 25 MG PO TABS: 50.0000 mg | ORAL_TABLET | Freq: Two times a day (BID) | ORAL | Status: DC

## 2015-04-15 MED ORDER — APIXABAN 5 MG PO TABS
5.0000 mg | ORAL_TABLET | Freq: Two times a day (BID) | ORAL | Status: DC
  Administered 2015-04-15 (×2): 5 mg via ORAL
  Filled 2015-04-15 (×3): qty 1

## 2015-04-15 MED ORDER — NITROGLYCERIN 0.4 MG SL SUBL: 0.4000 mg | SUBLINGUAL_TABLET | SUBLINGUAL | Status: DC | PRN

## 2015-04-15 MED ORDER — PANTOPRAZOLE SODIUM 40 MG PO TBEC
40.0000 mg | DELAYED_RELEASE_TABLET | Freq: Every day | ORAL | Status: DC
  Administered 2015-04-15: 40 mg via ORAL

## 2015-04-15 NOTE — ED Notes (Signed)
PT sent to floor with his belongings.

## 2015-04-15 NOTE — Progress Notes (Signed)
Pt discharge education and instructions completed with pt and son at bedside; both voices understanding and denies any questions. Pt IV and telemetry removed; pt discharge home with son to transport him home. Pt transported off unit via wheelchair with son and belongings to the side. Arabella Merles Yarethzy Croak RN.

## 2015-04-15 NOTE — Evaluation (Signed)
Physical Therapy Evaluation and Discharge Patient Details Name: [name] MRN: [id number] DOB: 02-May-1931 Today's Date: 04/15/2015   History of Present Illness  [name] is a 81 y.o. male who was brought to the emergency department via EMS 04/14/15 after he collapsed and passed out at home after dinner. He does not remember how things happened, he denies chest pain, palpitations, dyspnea, diaphoresis, nausea or blurred vision. His son states the it took him about 10 minutes to start responding and he was not fully awake until EMS was bringing him to the hospital.  Clinical Impression  Patient evaluated by Physical Therapy with no further acute PT needs identified. All education has been completed and the patient has no further questions. At the time of PT eval pt was able to perform transfers and ambulation at a modified independent level. Pt demonstrated higher level balance activity such as side stepping, stepping over objects, directional changes, and speed changes without LOB. See below for any follow-up Physial Therapy or equipment needs. PT is signing off. Thank you for this referral.     Follow Up Recommendations No PT follow up    Equipment Recommendations  None recommended by PT    Recommendations for Other Services       Precautions / Restrictions Precautions Precautions: None Restrictions Weight Bearing Restrictions: No      Mobility  Bed Mobility               General bed mobility comments: Pt was standing at the window when PT arrived.   Transfers Overall transfer level: Independent Equipment used: None             General transfer comment: No cues or physical assistance needed  Ambulation/Gait Ambulation/Gait assistance: Modified independent (Device/Increase time) Ambulation Distance (Feet): 400 Feet Assistive device: None Gait Pattern/deviations: Step-through pattern;Decreased stride length;Trunk flexed Gait velocity: Decreased    General Gait Details: Minimal unsteadiness which pt reports is his baseline. No physical assist required.   Stairs            Wheelchair Mobility    Modified Rankin (Stroke Patients Only)       Balance Overall balance assessment: Needs assistance Sitting-balance support: Feet supported;No upper extremity supported Sitting balance-Leahy Scale: Good     Standing balance support: No upper extremity supported;During functional activity Standing balance-Leahy Scale: Good                               Pertinent Vitals/Pain Pain Assessment: No/denies pain    Home Living Family/patient expects to be discharged to:: Private residence Living Arrangements: Spouse/significant other Available Help at Discharge: Family;Available 24 hours/day Type of Home: House Home Access: Stairs to enter Entrance Stairs-Rails: None Entrance Stairs-Number of Steps: 1 in back, 4 in front Home Layout: One level Home Equipment: Cane - single point      Prior Function Level of Independence: Independent         Comments: 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     Hand Dominance   Dominant Hand: Right    Extremity/Trunk Assessment   Upper Extremity Assessment: Defer to OT evaluation           Lower Extremity Assessment: Overall WFL for tasks assessed      Cervical / Trunk Assessment: Normal  Communication   Communication: HOH  Cognition Arousal/Alertness: Awake/alert Behavior During Therapy: WFL for tasks assessed/performed Overall Cognitive Status: Within  Functional Limits for tasks assessed                      General Comments      Exercises        Assessment/Plan    PT Assessment Patent does not need any further PT services  PT Diagnosis Difficulty walking   PT Problem List    PT Treatment Interventions     PT Goals (Current goals can be found in the Care Plan section) Acute Rehab PT Goals Patient Stated Goal: go home today,  "I was supposed to go home before lunch". PT Goal Formulation: All assessment and education complete, DC therapy    Frequency     Barriers to discharge        Co-evaluation               End of Session   Activity Tolerance: Patient tolerated treatment well Patient left: Other (comment) (Standing at window with son present) Nurse Communication: Mobility status    Functional Assessment Tool Used: Clinical judgement Functional Limitation: Mobility: Walking and moving around Mobility: Walking and Moving Around Current Status (Z6109): At least 1 percent but less than 20 percent impaired, limited or restricted Mobility: Walking and Moving Around Goal Status [phone]): At least 1 percent but less than 20 percent impaired, limited or restricted    Time: 1430-1446 PT Time Calculation (min) (ACUTE ONLY): 16 min   Charges:   PT Evaluation $Initial PT Evaluation Tier I: 1 Procedure     PT G Codes:   PT G-Codes **NOT FOR INPATIENT CLASS** Functional Assessment Tool Used: Clinical judgement Functional Limitation: Mobility: Walking and moving around Mobility: Walking and Moving Around Current Status (U9811): At least 1 percent but less than 20 percent impaired, limited or restricted Mobility: Walking and Moving Around Goal Status [phone]): At least 1 percent but less than 20 percent impaired, limited or restricted    Conni Slipper 04/15/2015, 3:08 PM   Conni Slipper, PT, DPT Acute Rehabilitation Services Pager: [phone]

## 2015-04-15 NOTE — Discharge Instructions (Signed)
Syncope °Syncope is a medical term for fainting or passing out. This means you lose consciousness and drop to the ground. People are generally unconscious for less than 5 minutes. You may have some muscle twitches for up to 15 seconds before waking up and returning to normal. Syncope occurs more often in older adults, but it can happen to anyone. While most causes of syncope are not dangerous, syncope can be a sign of a serious medical problem. It is important to seek medical care.  °CAUSES  °Syncope is caused by a sudden drop in blood flow to the brain. The specific cause is often not determined. Factors that can bring on syncope include: °· Taking medicines that lower blood pressure. °· Sudden changes in posture, such as standing up quickly. °· Taking more medicine than prescribed. °· Standing in one place for too long. °· Seizure disorders. °· Dehydration and excessive exposure to heat. °· Low blood sugar (hypoglycemia). °· Straining to have a bowel movement. °· Heart disease, irregular heartbeat, or other circulatory problems. °· Fear, emotional distress, seeing blood, or severe pain. °SYMPTOMS  °Right before fainting, you may: °· Feel dizzy or light-headed. °· Feel nauseous. °· See all white or all black in your field of vision. °· Have cold, clammy skin. °DIAGNOSIS  °Your health care provider will ask about your symptoms, perform a physical exam, and perform an electrocardiogram (ECG) to record the electrical activity of your heart. Your health care provider may also perform other heart or blood tests to determine the cause of your syncope which may include: °· Transthoracic echocardiogram (TTE). During echocardiography, sound waves are used to evaluate how blood flows through your heart. °· Transesophageal echocardiogram (TEE). °· Cardiac monitoring. This allows your health care provider to monitor your heart rate and rhythm in real time. °· Holter monitor. This is a portable device that records your  heartbeat and can help diagnose heart arrhythmias. It allows your health care provider to track your heart activity for several days, if needed. °· Stress tests by exercise or by giving medicine that makes the heart beat faster. °TREATMENT  °In most cases, no treatment is needed. Depending on the cause of your syncope, your health care provider may recommend changing or stopping some of your medicines. °HOME CARE INSTRUCTIONS °· Have someone stay with you until you feel stable. °· Do not drive, use machinery, or play sports until your health care provider says it is okay. °· Keep all follow-up appointments as directed by your health care provider. °· Lie down right away if you start feeling like you might faint. Breathe deeply and steadily. Wait until all the symptoms have passed. °· Drink enough fluids to keep your urine clear or pale yellow. °· If you are taking blood pressure or heart medicine, get up slowly and take several minutes to sit and then stand. This can reduce dizziness. °SEEK IMMEDIATE MEDICAL CARE IF:  °· You have a severe headache. °· You have unusual pain in the chest, abdomen, or back. °· You are bleeding from your mouth or rectum, or you have black or tarry stool. °· You have an irregular or very fast heartbeat. °· You have pain with breathing. °· You have repeated fainting or seizure-like jerking during an episode. °· You faint when sitting or lying down. °· You have confusion. °· You have trouble walking. °· You have severe weakness. °· You have vision problems. °If you fainted, call your local emergency services (911 in [place] Do not drive   yourself to the hospital.  MAKE SURE YOU:  Understand these instructions.  Will watch your condition.  Will get help right away if you are not doing well or get worse. Document Released: 09/04/2005 Document Revised: 09/09/2013 Document Reviewed: 11/03/2011 [facility] Patient Information 2015 [place], [place]. This information is not intended to replace  advice given to you by your health care provider. Make sure you discuss any questions you have with your health care provider.   Information on my medicine - ELIQUIS (apixaban)  This medication education was reviewed with me or my healthcare representative as part of my discharge preparation.   Why was Eliquis prescribed for you? Eliquis was prescribed for you to reduce the risk of a blood clot forming that can cause a stroke if you have a medical condition called atrial fibrillation (a type of irregular heartbeat).  What do You need to know about Eliquis ? Take your Eliquis TWICE DAILY - one tablet in the morning and one tablet in the evening with or without food. If you have difficulty swallowing the tablet whole please discuss with your pharmacist how to take the medication safely.  Take Eliquis exactly as prescribed by your doctor and DO NOT stop taking Eliquis without talking to the doctor who prescribed the medication.  Stopping may increase your risk of developing a stroke.  Refill your prescription before you run out.  After discharge, you should have regular check-up appointments with your healthcare provider that is prescribing your Eliquis.  In the future your dose may need to be changed if your kidney function or weight changes by a significant amount or as you get older.  What do you do if you miss a dose? If you miss a dose, take it as soon as you remember on the same day and resume taking twice daily.  Do not take more than one dose of ELIQUIS at the same time to make up a missed dose.  Important Safety Information A possible side effect of Eliquis is bleeding. You should call your healthcare provider right away if you experience any of the following: ? Bleeding from an injury or your nose that does not stop. ? Unusual colored urine (red or dark brown) or unusual colored stools (red or black). ? Unusual bruising for unknown reasons. ? A serious fall or if you hit your  head (even if there is no bleeding).  Some medicines may interact with Eliquis and might increase your risk of bleeding or clotting while on Eliquis. To help avoid this, consult your healthcare provider or pharmacist prior to using any new prescription or non-prescription medications, including herbals, vitamins, non-steroidal anti-inflammatory drugs (NSAIDs) and supplements.  This website has more information on Eliquis (apixaban): http://[email or web address].com/eliquis/home

## 2015-04-15 NOTE — Discharge Summary (Signed)
[facility]  Physician Discharge Summary   Patient ID: [name] MRN: [id number] DOB/AGE: 79/20/1932 79 y.o.  Admit date: 04/14/2015 Discharge date: 04/15/2015  PCP: Barbette Reichmann, MD  DISCHARGE DIAGNOSES:  Principal Problem:   Syncope and collapse Active Problems:   CHF (congestive heart failure)   HTN (hypertension)   Atrial fibrillation   RECOMMENDATIONS FOR OUTPATIENT FOLLOW UP: 1. Patient has an upcoming appointment with his cardiologist for DC cardioversion   DISCHARGE CONDITION: fair  Diet recommendation: Heart healthy  Filed Weights   04/14/15 1914 04/15/15 0032  Weight: 73.483 kg (162 lb) 74.2 kg (163 lb 9.3 oz)    INITIAL HISTORY: 81 year old Caucasian male with a past medical history of atrial fibrillation, chronic congestive heart failure, presented after a syncopal episode at home. He was hospitalized for further management.  Consultations:  Phone discussion with his cardiologist, Dr. Gwen Pounds  Procedures:  None  HOSPITAL COURSE:   Syncope Considering his cardiac history, concern was about malignant arrhythmia. Patient was hospitalized. Telemetry shows PVCs, but no sustained VT. No other arrhythmias noted. Patient is back to baseline today. Denies any chest pain or shortness of breath. His cardiac enzymes are normal. Labs are all unremarkable. Vital signs are stable. Orthostatics were normal. Discussed his presentation with his cardiologist, Dr. Gwen Pounds. Recommends discharging him on a lower dose of metoprolol. Continue amiodarone at current dose. Continue anticoagulation at current dose. He will see the patient early next week for DC cardioversion. He will considered either event monitor or loop recorder subsequently.  History of chronic systolic congestive heart failure Stable and well compensated. He was on Lasix once a day, which has been resumed. Continue the same at home.  History of atrial fibrillation Plan is for DC  cardioversion early next week. Telemetry does not show any episodes of rapid rate. Continue anticoagulation.  Patient is stable. Wants to go home. He will be evaluated by physical and occupational therapy. Should be able to go home subsequently.    PERTINENT LABS:  The results of significant diagnostics from this hospitalization (including imaging, microbiology, ancillary and laboratory) are listed below for reference.      Labs: Basic Metabolic Panel:  Recent Labs Lab 04/14/15 1922 04/15/15 0924  NA 139 139  K 3.8 3.4*  CL 105 103  CO2 24 27  GLUCOSE 139* 145*  BUN 27* 21*  CREATININE 1.54* 1.25*  CALCIUM 8.4* 9.0  MG 1.8  --    CBC:  Recent Labs Lab 04/14/15 1922 04/15/15 0924  WBC 7.1 7.5  NEUTROABS 4.8  --   HGB 12.4* 13.3  HCT 37.2* 40.4  MCV 98.2 99.5  PLT 185 190   Cardiac Enzymes:  Recent Labs Lab 04/15/15 0109 04/15/15 0710 04/15/15 0924  TROPONINI <0.03 <0.03 <0.03   BNP: BNP (last 3 results)  Recent Labs  03/22/15 0900  BNP 574.0*    IMAGING STUDIES Dg Chest 2 View  04/14/2015   CLINICAL DATA:  Syncope with fall earlier today  EXAM: CHEST  2 VIEW  COMPARISON:  March 22, 2015  FINDINGS: There is an apparent nipple shadow on the right. There is no edema or consolidation. The heart is upper normal in size with pulmonary vascularity within normal limits. No adenopathy. There is degenerative change in the thoracic spine. There is an azygos lobe on the right, an anatomic variant.  IMPRESSION: No edema or consolidation.   Electronically Signed   By: Bretta Bang III M.D.   On: 04/14/2015 19:52   Dg  Chest Port 1 View  03/22/2015   CLINICAL DATA:  Elevated blood pressure heart rate, shortness of breath, history hypertension, atrial fibrillation, prior MI  EXAM: PORTABLE CHEST - 1 VIEW  COMPARISON:  Portable exam 0847 hours compared to 03/26/2014  FINDINGS: Enlargement of cardiac silhouette with pulmonary vascular congestion.  Perihilar and basilar  infiltrates favoring mild pulmonary edema and CHF.  Small bibasilar pleural effusions and minimal RIGHT basilar atelectasis.  No pneumothorax.  Bones unremarkable.  IMPRESSION: Probable mild CHF.   Electronically Signed   By: Ulyses Southward M.D.   On: 03/22/2015 09:09    DISCHARGE EXAMINATION: Filed Vitals:   04/15/15 0032 04/15/15 0602 04/15/15 1018 04/15/15 1323  BP: 137/92 137/87 133/76 126/69  Pulse: 72 77 74 73  Temp: 98.2 F (36.8 C) 97.8 F (36.6 C) 98 F (36.7 C) 97.5 F (36.4 C)  TempSrc: Oral Oral Oral Oral  Resp: Height:      Weight: 74.2 kg (163 lb 9.3 oz)     SpO2: 99% 98% 100% 100%   General appearance: alert, cooperative, appears stated age and no distress Resp: clear to auscultation bilaterally Cardio: History and S2 is irregularly irregular. No S3, S4. No rubs, murmurs, or bruit. GI: soft, non-tender; bowel sounds normal; no masses,  no organomegaly  DISPOSITION: Home  Discharge Instructions    Call MD for:  difficulty breathing, headache or visual disturbances    Complete by:  As directed      Call MD for:  extreme fatigue    Complete by:  As directed      Call MD for:  persistant dizziness or light-headedness    Complete by:  As directed      Call MD for:  severe uncontrolled pain    Complete by:  As directed      Diet - low sodium heart healthy    Complete by:  As directed      Discharge instructions    Complete by:  As directed   Patient to follow-up with his cardiologist next week for DC cardioversion.  You were cared for by a hospitalist during your hospital stay. If you have any questions about your discharge medications or the care you received while you were in the hospital after you are discharged, you can call the unit and asked to speak with the hospitalist on call if the hospitalist that took care of you is not available. Once you are discharged, your primary care physician will handle any further medical issues. Please note that NO  REFILLS for any discharge medications will be authorized once you are discharged, as it is imperative that you return to your primary care physician (or establish a relationship with a primary care physician if you do not have one) for your aftercare needs so that they can reassess your need for medications and monitor your lab values. If you do not have a primary care physician, you can call [phone] for a physician referral.     Increase activity slowly    Complete by:  As directed            ALLERGIES:  Allergies  Allergen Reactions  . Lipitor [Atorvastatin]     weakness     Current Discharge Medication List    CONTINUE these medications which have CHANGED   Details  metoprolol tartrate (LOPRESSOR) 25 MG tablet Take 2 tablets (50 mg total) by mouth 2 (two) times daily.      CONTINUE  these medications which have NOT CHANGED   Details  amiodarone (PACERONE) 400 MG tablet Take 400 mg by mouth daily.    ELIQUIS 5 MG TABS tablet Take 5 mg by mouth 2 (two) times daily. Refills: 11    Multiple Vitamin (MULTI-VITAMINS) TABS Take 1 tablet by mouth daily.    nitroGLYCERIN (NITROSTAT) 0.4 MG SL tablet Place 0.4 mg under the tongue every 5 (five) minutes x 3 doses as needed. For chest pain. If no relief call MD or go to emergency room.    omeprazole (PRILOSEC) 20 MG capsule Take 20 mg by mouth daily.    vitamin E 400 UNIT capsule Take 400 Units by mouth daily.    furosemide (LASIX) 40 MG tablet Take 1 tablet (40 mg total) by mouth daily. Qty: 30 tablet, Refills: 5       Follow-up Information    Follow up with [name], MD.   Specialty:  Internal Medicine   Why:  see as scheduled. Call his office if you are not sure about your appointment date and time.   Contact information:   [address] [place] [address] [phone]       Call Barbette Reichmann, MD.   Specialty:  Internal Medicine   Why:  As needed   Contact information:   [address] [place] [address] [phone]       TOTAL DISCHARGE TIME: 35 minutes  [facility]  [facility] Pager [phone]  04/15/2015, 2:52 PM

## 2015-04-15 NOTE — Progress Notes (Addendum)
Occupational Therapy Evaluation Patient Details Name: [name] MRN: [id number] DOB: 07/26/1931 Today's Date: 04/15/2015    History of Present Illness [name] is a 81 y.o. male who was brought to the emergency department via EMS 04/14/15 after he collapsed and passed out at home after dinner. He does not remember how things happened, he denies chest pain, palpitations, dyspnea, diaphoresis, nausea or blurred vision. His son states the it took him about 10 minutes to start responding and he was not fully awake until EMS was bringing him to the hospital.   Clinical Impression   Patient admitted with above. Patient independent PTA. Patient currently functioning at an independent level. D/C from acute OT services and no additional follow-up OT needs at this time. All appropriate education provided to patient and son. Please re-order OT if needed.    Patient's 02 sats dropped > 86% after functional ambulation. Encouraged rest break and pursed lip breathing and sats increased. Discussed energy conservation with patient and encouraged him to listen to his body, taking breaks prn.     Follow Up Recommendations  Other (comment);Supervision - Intermittent (Cardio Rehab for endurance and activity tolerance)    Equipment Recommendations  None recommended by OT    Recommendations for Other Services  None at this time   Precautions / Restrictions Precautions Precautions: None Restrictions Weight Bearing Restrictions: No   Mobility Bed Mobility General bed mobility comments: Pt found seated EOB upon OT entering room  Transfers Overall transfer level: Independent Equipment used: None General transfer comment: No cues or physical assistance needed    Balance Overall balance assessment: Needs assistance Sitting-balance support: No upper extremity supported;Feet supported Sitting balance-Leahy Scale: Good     Standing balance support: No upper extremity supported;During  functional activity Standing balance-Leahy Scale: Good    ADL Overall ADL's : Independent;At baseline General ADL Comments: Pt limited by decreased endurance. Pt states that he was independent with ADLs and IADLs a week ago, but his son has had to help him with IADL tasks like mowing the lawn recently.     Pertinent Vitals/Pain Pain Assessment: No/denies pain     Hand Dominance Right   Extremity/Trunk Assessment Upper Extremity Assessment Upper Extremity Assessment: Overall WFL for tasks assessed   Lower Extremity Assessment Lower Extremity Assessment: Defer to PT evaluation   Cervical / Trunk Assessment Cervical / Trunk Assessment: Normal   Communication Communication Communication: HOH   Cognition Arousal/Alertness: Awake/alert Behavior During Therapy: WFL for tasks assessed/performed Overall Cognitive Status: Within Functional Limits for tasks assessed              Home Living Family/patient expects to be discharged to:: Private residence Living Arrangements: Spouse/significant other Available Help at Discharge: Family;Available 24 hours/day (wife, son lives close by) Type of Home: House Home Access: Stairs to enter [employer] of Steps: 1 in back, 4 in front Entrance Stairs-Rails: None Home Layout: One level     Bathroom Shower/Tub: Tub/shower unit;Curtain   Firefighter: Handicapped height     Home Equipment: Medical laboratory scientific officer - single point    Prior Functioning/Environment Level of Independence: Independent     OT Diagnosis: Generalized weakness   OT Problem List:  n/a, no acute OT needs identified    OT Treatment/Interventions:   n/a, no acute OT needs identified    OT Goals(Current goals can be found in the care plan section) Acute Rehab OT Goals Patient Stated Goal: go home today, "I was supposed to go home before lunch". OT  Goal Formulation: All assessment and education complete, DC therapy  OT Frequency:   n/a, no acute OT needs identified     Barriers to D/C:  None known at this time   End of Session Activity Tolerance: Patient tolerated treatment well Patient left: in bed;with call bell/phone within reach;with family/visitor present (seated EOB)   Time: 1610-9604 OT Time Calculation (min): 15 min Charges:  OT General Charges $OT Visit: 1 Procedure OT Evaluation $Initial OT Evaluation Tier I: 1 Procedure G-Codes: OT G-codes **NOT FOR INPATIENT CLASS** Functional Limitation: Self care Self Care Current Status (V4098): 0 percent impaired, limited or restricted Self Care Goal Status (J1914): 0 percent impaired, limited or restricted Self Care Discharge Status (N8295): 0 percent impaired, limited or restricted  Boykin Baetz , MS, OTR/L, CLT Pager: [phone] 04/15/2015, 2:18 PM

## 2015-04-19 ENCOUNTER — Ambulatory Visit: Payer: PPO | Admitting: Anesthesiology

## 2015-04-19 ENCOUNTER — Ambulatory Visit
Admission: RE | Admit: 2015-04-19 | Discharge: 2015-04-19 | Disposition: A | Discharge status: Home / Self Care | Payer: PPO | Source: Ambulatory Visit | Attending: Internal Medicine | Admitting: Internal Medicine

## 2015-04-19 ENCOUNTER — Encounter
Admission: RE | Disposition: A | Discharge status: Home / Self Care | Payer: Self-pay | Source: Ambulatory Visit | Attending: Internal Medicine

## 2015-04-19 ENCOUNTER — Encounter: Payer: Self-pay | Admitting: *Deleted

## 2015-04-19 DIAGNOSIS — Z8249 Family history of ischemic heart disease and other diseases of the circulatory system: Secondary | ICD-10-CM | POA: Diagnosis not present

## 2015-04-19 DIAGNOSIS — E785 Hyperlipidemia, unspecified: Secondary | ICD-10-CM | POA: Insufficient documentation

## 2015-04-19 DIAGNOSIS — I10 Essential (primary) hypertension: Secondary | ICD-10-CM | POA: Insufficient documentation

## 2015-04-19 DIAGNOSIS — I252 Old myocardial infarction: Secondary | ICD-10-CM | POA: Insufficient documentation

## 2015-04-19 DIAGNOSIS — I110 Hypertensive heart disease with heart failure: Secondary | ICD-10-CM | POA: Insufficient documentation

## 2015-04-19 DIAGNOSIS — Z87891 Personal history of nicotine dependence: Secondary | ICD-10-CM | POA: Insufficient documentation

## 2015-04-19 DIAGNOSIS — I509 Heart failure, unspecified: Secondary | ICD-10-CM | POA: Insufficient documentation

## 2015-04-19 DIAGNOSIS — Z79899 Other long term (current) drug therapy: Secondary | ICD-10-CM | POA: Insufficient documentation

## 2015-04-19 DIAGNOSIS — I4891 Unspecified atrial fibrillation: Secondary | ICD-10-CM | POA: Diagnosis not present

## 2015-04-19 DIAGNOSIS — K861 Other chronic pancreatitis: Secondary | ICD-10-CM | POA: Diagnosis not present

## 2015-04-19 DIAGNOSIS — I25810 Atherosclerosis of coronary artery bypass graft(s) without angina pectoris: Secondary | ICD-10-CM | POA: Insufficient documentation

## 2015-04-19 DIAGNOSIS — I2510 Atherosclerotic heart disease of native coronary artery without angina pectoris: Secondary | ICD-10-CM | POA: Diagnosis not present

## 2015-04-19 DIAGNOSIS — I472 Ventricular tachycardia: Secondary | ICD-10-CM | POA: Insufficient documentation

## 2015-04-19 HISTORY — DX: Ventricular tachycardia: I47.2

## 2015-04-19 HISTORY — DX: Unspecified hemorrhoids: K64.9

## 2015-04-19 HISTORY — DX: Rheumatic mitral valve disease, unspecified: I05.9

## 2015-04-19 HISTORY — DX: Ventricular tachycardia, unspecified: I47.20

## 2015-04-19 HISTORY — PX: ELECTROPHYSIOLOGIC STUDY: SHX172A

## 2015-04-19 SURGERY — CARDIOVERSION (CATH LAB)
Anesthesia: General

## 2015-04-19 MED ORDER — METOPROLOL TARTRATE 25 MG PO TABS: 25.0000 mg | ORAL_TABLET | Freq: Two times a day (BID) | ORAL | Status: AC

## 2015-04-19 MED ORDER — SODIUM CHLORIDE 0.9 % IV SOLN
INTRAVENOUS | Status: DC
  Administered 2015-04-19: 08:00:00 via INTRAVENOUS

## 2015-04-19 MED ORDER — PROPOFOL 10 MG/ML IV BOLUS
INTRAVENOUS | Status: DC | PRN
  Administered 2015-04-19 (×2): 10 mg via INTRAVENOUS
  Administered 2015-04-19: 40 mg via INTRAVENOUS

## 2015-04-19 NOTE — Anesthesia Preprocedure Evaluation (Signed)
Anesthesia Evaluation  Patient identified by MRN, date of birth, ID band Patient awake    Reviewed: Allergy & Precautions, H&P , NPO status , Patient's Chart, lab work & pertinent test results, reviewed documented beta blocker date and time   Airway Mallampati: II  TM Distance: >3 FB Neck ROM: full    Dental no notable dental hx. (+) Edentulous Upper, Edentulous Lower   Pulmonary neg pulmonary ROS, former smoker,  breath sounds clear to auscultation  Pulmonary exam normal       Cardiovascular Exercise Tolerance: Good hypertension, + CAD, + Past MI, +CHF and + DOE negative cardio ROS  Atrial Fibrillation + Valvular Problems/Murmurs MR Rhythm:regular Rate:Normal     Neuro/Psych negative neurological ROS  negative psych ROS   GI/Hepatic negative GI ROS, Neg liver ROS, GERD-  ,  Endo/Other  negative endocrine ROS  Renal/GU negative Renal ROS  negative genitourinary   Musculoskeletal   Abdominal   Peds  Hematology negative hematology ROS (+) anemia ,   Anesthesia Other Findings   Reproductive/Obstetrics negative OB ROS                             Anesthesia Physical Anesthesia Plan  ASA: III  Anesthesia Plan: General   Post-op Pain Management:    Induction:   Airway Management Planned:   Additional Equipment:   Intra-op Plan:   Post-operative Plan:   Informed Consent: I have reviewed the patients History and Physical, chart, labs and discussed the procedure including the risks, benefits and alternatives for the proposed anesthesia with the patient or authorized representative who has indicated his/her understanding and acceptance.   Dental Advisory Given  Plan Discussed with: CRNA  Anesthesia Plan Comments:         Anesthesia Quick Evaluation

## 2015-04-19 NOTE — CV Procedure (Signed)
Electrical Cardioversion Procedure Note [name] [id number] 29-May-1931  Procedure: Electrical Cardioversion Indications:  Atrial Fibrillation  Procedure Details Consent: Risks of procedure as well as the alternatives and risks of each were explained to the (patient/caregiver).  Consent for procedure obtained. Time Out: Verified patient identification, verified procedure, site/side was marked, verified correct patient position, special equipment/implants available, medications/allergies/relevent history reviewed, required imaging and test results available.  Performed  Patient placed on cardiac monitor, pulse oximetry, supplemental oxygen as necessary.  Sedation given: Benzodiazepines and Short-acting barbiturates Pacer pads placed anterior and posterior chest.  Cardioverted 1 time(s).  Cardioverted at 120J.  Evaluation Findings: Post procedure EKG shows: NSR Complications: None Patient did tolerate procedure well.   [name] 04/19/2015, 7:50 AM

## 2015-04-19 NOTE — Transfer of Care (Signed)
Immediate Anesthesia Transfer of Care Note  Patient: [name]  Procedure(s) Performed: Procedure(s): Cardioversion (N/A)  Patient Location: Short Stay  Anesthesia Type:General  Level of Consciousness: sedated  Airway & Oxygen Therapy: Patient Spontanous Breathing and Patient connected to nasal cannula oxygen  Post-op Assessment: Report given to RN and Post -op Vital signs reviewed and stable  Post vital signs: Reviewed and stable  Last Vitals:  Filed Vitals:   04/19/15 0754  BP: 112/67  Pulse: 48  Temp:   Resp: 17    Complications: No apparent anesthesia complications

## 2015-04-19 NOTE — Anesthesia Postprocedure Evaluation (Signed)
  Anesthesia Post-op Note  Patient: [name]  Procedure(s) Performed: Procedure(s): Cardioversion (N/A)  Anesthesia type:General  Patient location: PACU  Post pain: Pain level controlled  Post assessment: Post-op Vital signs reviewed, Patient's Cardiovascular Status Stable, Respiratory Function Stable, Patent Airway and No signs of Nausea or vomiting  Post vital signs: Reviewed and stable  Last Vitals:  Filed Vitals:   04/19/15 0830  BP: 123/73  Pulse: 52  Temp:   Resp: 22    Level of consciousness: awake, alert  and patient cooperative  Complications: No apparent anesthesia complications

## 2015-04-19 NOTE — Anesthesia Procedure Notes (Signed)
Performed by: Niajah Sipos Pre-anesthesia Checklist: Patient identified, Emergency Drugs available, Suction available, Patient being monitored and Timeout performed Patient Re-evaluated:Patient Re-evaluated prior to inductionOxygen Delivery Method: Nasal cannula Intubation Type: IV induction       

## 2015-05-05 NOTE — Patient Outreach (Signed)
[facility]) Care Management  05/05/2015  [name] Jan 27, 1931 [id number]   Referral from [facility], assigned to [name], RNCM and [place], [place] for patient outreach.  Elvis Laufer L. Delcenia Inman, AAS [facility] Care Management Assistant

## 2015-05-12 ENCOUNTER — Encounter: Payer: Self-pay | Admitting: *Deleted

## 2015-05-12 ENCOUNTER — Other Ambulatory Visit: Payer: Self-pay

## 2015-05-12 DIAGNOSIS — I10 Essential (primary) hypertension: Secondary | ICD-10-CM

## 2015-05-12 NOTE — Patient Outreach (Signed)
[facility]) Care Management  05/12/2015  [name] 05-Nov-1930 [id number]   Referral from [facility] for HF, HTN and assistance for dietary management.  Patient referred to a health coach to provide education regarding HF, HTN and dietary management.  There are no community resources identified that provide no/low cost dietary management.    Adriana Reams [facility] Care Management [phone]

## 2015-05-13 NOTE — Patient Outreach (Signed)
[facility]) Care Management  05/13/2015  [name] 1931/03/27 [id number]  Telephone call to patient regarding [facility] referral.  Unable to reach patient. HIPAA compliant voice message left with call back phone number.   PLAN: RNCM will attempt 2nd telephone outreach to patient within 1 week.   [name] RN,BSN,CCM [facility] Telephonic Care Coordinator [phone]

## 2015-05-14 ENCOUNTER — Ambulatory Visit: Payer: Self-pay

## 2015-05-17 ENCOUNTER — Encounter: Payer: PPO | Attending: Internal Medicine | Admitting: Dietician

## 2015-05-17 VITALS — Ht 70.0 in | Wt 164.2 lb

## 2015-05-17 DIAGNOSIS — R634 Abnormal weight loss: Secondary | ICD-10-CM | POA: Diagnosis present

## 2015-05-17 NOTE — Patient Instructions (Signed)
   Make sure to include a protein food with each meal: lean meat, lowfat cheese, small portion of peanut butter, or eggs  Include a morning and evening snack every day: try some crackers with juice or lowfat cheese with crackers and 4oz juice.   Try beano tablets when you eat to prevent gas.   You can try a protein drink, such as premier protein, or Glucerna, or Carnation breakfast drink as a snack. Protein will help to keep your lean muscle weight and strength.

## 2015-05-17 NOTE — Progress Notes (Signed)
Medical Nutrition Therapy: Visit start time: 1100  end time: 1200  Assessment:  Diagnosis: abnormal weight loss Past medical history: acute pancreatitis x2 per patient, MIx3, CHF, HTN, GERD with esophagitis per MD notes  Psychosocial issues/ stress concerns: patient reports moderate stress level, exercises to manage stress Preferred learning method:  . Auditory  Current weight: 164.3lbs with shoes  Height: 5'10" Medications, supplements: reviewed list in chart with patient Progress and evaluation: Patient reports weight loss since starting diuretic medication, denies changes in eating habits.      He reports increasing weakness and inability to exercise as long at a time as he used to.      He states his eight was over 200lbs at one point. He would now like to gain 5-10lbs back.      Physical activity: cardio at gym such as elliptical  4 times a week; recently only able to do a few minutes at a time, but he was exercising 1 hour at a time.  Dietary Intake:  Usual eating pattern includes 3 meals and 1-2 snacks per day. Dining out frequency: 3 meals per week.  Breakfast: 8/29 3 fresh eggs with wiener, toast with butter and honey; egg sandwich with cheese; grits with ham; oatmeal and fruit or cheerios; grape juice, coffee Snack: none, usually not hungry Lunch: chicken noodle soup low sodium with extra chicken. Sometimes potatoes, rarely hamburger Snack: usually none  Supper: sometimes soup; sometimes green beans, fewer starchy beans due to gas. Has avoided nuts due to gas. Snack: usually none Beverages: mostly water, rarely sprite, black decaf coffee  Nutrition Care Education: Topics covered: weight management Basic nutrition: basic food groups, appropriate nutrient balance, appropriate meal and snack schedule Weight control: benefits of weight gain and lean body weight, identifying healthy weight, determining reasonable weight goal, behavioral changes for weight loss Other dietary  instruction:  Increasing protein intake for strength, low fat diet to prevent pancreatitis, including snacks regularly to boost kcal intake.  Nutritional Diagnosis:  [place]-3.2 Unintentional weight loss As related to fluid reduction and decrease in caloric intake.  As evidenced by patient report.  Intervention: Instruction as noted above.    Patient will work to increase protein intake and include more snacks for some weight gain.   RD will plan to contact patient in 3-4 weeks to check on his progress, schedule follow-up at that time if needed.  Education Materials given:  . Food lists/ Planning A Heart Healthy Meal . Goals/ instructions   Learner/ who was taught:  . Patient   Level of understanding: Marland Kitchen Verbalizes/ demonstrates competency  Demonstrated degree of understanding via:   Teach back Learning barriers: . None  Willingness to learn/ readiness for change: . Eager, change in progress   Monitoring and Evaluation:  Dietary intake, and body weight      follow up: prn

## 2015-05-18 ENCOUNTER — Other Ambulatory Visit: Payer: Self-pay

## 2015-05-18 DIAGNOSIS — I5022 Chronic systolic (congestive) heart failure: Secondary | ICD-10-CM

## 2015-05-18 NOTE — Patient Outreach (Signed)
[facility]) Care Management  05/18/2015  [name] 11/01/1930 [id number]   Request from [name], RN to assign RN Health Coach, assigned [name], RN.  Thanks, Corrie Mckusick. Sharlee Blew [facility] Care Management [facility] CM Assistant Phone: [phone] Fax: [phone]

## 2015-05-18 NOTE — Patient Outreach (Signed)
[facility]) Care Management  05/18/2015  [name] 05-04-1931 [id number]   SUBJECTIVE:  Telephone call to patient regarding [facility] referral.  Discussed and offered [facility] care management services to patient.  Patient verbally agreed to receive services.  Patient states he saw a dietician on yesterday.  States he was given dietary education material but is still unsure of what he should eat. Patient states he has been losing weight since he started taking his, "fluid pill."  Patient states he will talk to the doctor about this at his next scheduled appointment.  States the dietician advised him to start drinking protein drinks.  Patient states he has had pancreatitis in the past.  Patient voiced concern that he has to watch what he eats now because of this.   Patient states he was recently in the emergency room due to passing out.  Patient states, "i had electrical shock treatment to my heart and now I am feeling better." Patient states he has a follow up appointment scheduled with his cardiologist on 05/27/15.     Patient confirms he has heart failure. Patient states he takes his medication as prescribe.  Patient states he monitors his blood pressure at home daily and weighs occasionally.      ASSESSMENT:  [facility] referral.  Patient will benefit from referral to health coach for heart failure education/ management.  Dietary reinforcement and education as needed.   PLAN:  RNCM will refer patient to health coach.  [name] RN,BSN,CCM [facility] Telephonic Care Coordinator [phone]

## 2015-05-18 NOTE — Addendum Note (Signed)
Addended by: [name] E on: 05/18/2015 11:03 AM   Modules accepted: Orders

## 2015-05-19 ENCOUNTER — Other Ambulatory Visit: Payer: Self-pay | Admitting: *Deleted

## 2015-05-19 ENCOUNTER — Encounter: Payer: Self-pay | Admitting: *Deleted

## 2015-05-19 NOTE — Patient Outreach (Signed)
[facility]) Care Management  05/19/2015  [name] 04/09/1931 [id number]  Telephone call to patient. Per patient he does not know the Zones or the action plan for CHF. Per patient he has fallen once and that was due to him passing out. The only injury was a bruise on his arm. Per patient he does not use a cane , walker or any aid to be mobile.  Per patient he would like information on congestive heart failure and pancreatitis. Patient states he does weigh himself  and he has lost a lot of weight due to the pancreatitis and the fluid pills. Patient weight is 158 today and his BP is 113/65.  Patient does exercise 4 x week on an elliptical but his tolerance has decrease for the length of time he is able to exercise. Per patient he is having a lot of gas with his meals and would like some information on foods that cause increase gas.    Assessment: Knowledge deficit in self disease management of CHF Patient agreed to follow up outreach  call in 30 days  Plan: RNCM will sent EMMI educational material on Congestive Heart  And Zones Patient will be able to verbalize understanding of all heart failure and zones Patient will report the importance of weighing each day RNCM will send patient a calendar book to record daily weight Patient will continue exercise routine and will try to slowly increase the time Patient will continue to take medication as prescribed by PCP  [name] BSN RN [facility] Care Management [phone];                                                                                                            RNCM will send patient EMMI

## 2015-05-28 ENCOUNTER — Encounter: Payer: Self-pay | Admitting: Internal Medicine

## 2015-06-18 ENCOUNTER — Other Ambulatory Visit: Payer: Self-pay | Admitting: Pharmacist

## 2015-06-18 ENCOUNTER — Encounter: Payer: Self-pay | Admitting: *Deleted

## 2015-06-18 ENCOUNTER — Ambulatory Visit: Payer: Self-pay | Admitting: *Deleted

## 2015-06-18 ENCOUNTER — Other Ambulatory Visit: Payer: Self-pay | Admitting: *Deleted

## 2015-06-18 DIAGNOSIS — I509 Heart failure, unspecified: Secondary | ICD-10-CM

## 2015-06-18 NOTE — Patient Outreach (Signed)
[facility]) Care Management  06/18/2015  [name] [birth year] [id number]   Request from [name], RN to assign Pharmacy, assigned Moises Blood, PharmD.  Thanks, Corrie Mckusick. Sharlee Blew [facility] Care Management [facility] CM Assistant Phone: [phone] Fax: [phone]

## 2015-06-18 NOTE — Patient Outreach (Signed)
[name] is a 81 y.o. male referred to pharmacy for medication management related to a medication question about having to go the the bathroom urgently and having large amounts of discharge of mucus from his rectum with gas, sometimes just discharge.  Review current medications with patient and update his medication list in EPIC accordingly. Patient reports that his rectal discharge has been going on for about a year. Reports that since that time he has seen his PCP, Dr. Marcello Fennel, about this issue as well as two different gastrointestinal doctors. Reports that he cannot remember their names. Reports that he has had multiple episodes of pancreatitis in the past. Reports that one of the gastrointestinal doctors checked his gall bladder and pancreas, but found no cause of the discharge.   Patient reports that he has been taking esomeprazole 20 mg daily, but that prior to that he was taking omeprazole 20 mg daily. Reports that the only time that the discharge became worse was when his doctor had him increase his omeprazole to 20 mg twice daily. However, patient reports that he then stopped the omperazole and started esomeprazole. Reports that he decided himself to stop his esomeprazole as well about two weeks ago to see if this type of medication was causing the effect. Reports that he has had no change in the discharge since stopping it.   Reports that he also just started taking Beano twice daily with breakfast and supper and that this has stopped his gas.  Discussed with patient that esomeprazole can cause flatulence, bowel irregularity and increased bowel movement. However, as patient has been off of this for two weeks without change, advise patient to again follow up with Dr. Marcello Fennel about this ongoing concern for further evaluation or referral. Patient states that he will follow up with Dr. Marcello Fennel.  Provided patient with my contact information in case he has medication questions in the  future.  Duanne Moron, PharmD Clinical Pharmacist [facility] Care Management [phone]

## 2015-06-18 NOTE — Patient Outreach (Signed)
[facility]) Care Management  06/18/2015  [name] September 13, 1931 [id number]   RNCM telephone call to patient. Hipaa compliance verified. Patient has been weighing daily and taking his blood pressure. Patient bp was 128/72 hr 68 weight 159 pounds. Patient is drinking strawberry shakes with protein supplements. Per patient he got his flu shot one month ago from [facility]. Patient did receive the clinical information and the calendar booklet sent by the RN Health Coach. Per patient he did read the information and was able to verbalize some of the symptoms of congestive heart failure. Patient is having some concerns about Gi problems that he is experiencing. An urgency to go to the bathroom for Bowel movements. He has abnormal amount of mucus discharge from rectum with gas or bowel movement. Patient is wondering if some of his medication is causing this. Patient agreed to follow up outreach call within a month.  Assessment: This patient would still  benefits from the health coach telephonic outreach for education and support of congestive heart failure self management This patient would benefit from a referral to a pharmacist to look and see if any interactions with his medications. Plan: Referral to pharmacist RN Health couch will send  patient Ensure coupons RN Health Knute Neu will provide ongoing education for patient on congestive heart failure through phone calls and sending printed information to patient for further discussion. RN Health coach sent Emmi on The heart and ejection fraction RN Health coach sent Emmi on What is Atrial Fibrillation RN Health coach sent Emmi on Pulmonary Edema RN Health coach sent Emmi on What is a stroke  [name] BSN RN [facility] Care Management [phone]

## 2015-07-16 ENCOUNTER — Other Ambulatory Visit: Payer: Self-pay | Admitting: *Deleted

## 2015-07-16 ENCOUNTER — Ambulatory Visit: Payer: Self-pay | Admitting: *Deleted

## 2015-07-16 NOTE — Patient Outreach (Signed)
[facility]) Care Management  07/16/2015  [name] Apr 17, 1931 [id number]   RN CM attempted  Follow up outreach call to patient.  Patient was unavailable. HIPPA compliance voicemail message was left with return callback number.    Gean MaidensFrances Tanith Dagostino BSN RN [facility] Care Management [phone]

## 2015-07-19 ENCOUNTER — Other Ambulatory Visit: Payer: Self-pay | Admitting: *Deleted

## 2015-07-19 NOTE — Patient Outreach (Signed)
[facility]) Care Management  07/19/2015  [name] September 09, 1931 [id number]  RN Health Coach telephone call to patient for follow up outreach.  Hipaa compliance verified.  Patient stated that he broke out in a sweat with very little exercise. Per patient he has no swelling in his ankles or extremities.  Patient has a Dr appointment on Wed with the cardiologist. I explained that he needs to let the cardiologist know this. Per patient he stated he would.   Patient is drinking the supplemental ensure.  Per patient he is not having any pain but has been having some flu-like symptoms. Per patient he has had a scratchy throat and a runny nose.  Patient has been reading the educational material the RN Health Coach had sent to him. He is able to verbalize the information read.  Patient agreed to follow outreach call.  Assessment: This patient would continue to benefit from Express ScriptsHealth Coach telephonic outreach for education and support for congestive heart self management. Patient is having new onset of exertional episode with minimal exercise  Plan: RN Health could will continue to provide education for the patient through phone calls and sending printed information to patient for further discussion.  RN Health coach will call the patient within a week for follow up on Cardiologist visit RN will send patient EMMI on cold and flu.  Gean MaidensFrances Tadeo Besecker BSN RN [facility] Care Management [phone]

## 2015-07-22 ENCOUNTER — Encounter: Payer: Self-pay | Admitting: *Deleted

## 2015-07-28 ENCOUNTER — Other Ambulatory Visit: Payer: Self-pay | Admitting: *Deleted

## 2015-07-28 NOTE — Patient Outreach (Signed)
[facility]) Care Management  07/28/2015  [name] February 16, 1931 [id number]   RN CM attempted  Follow up outreach call to patien.  Patient was unavailable. HIPPA compliance voicemail message was left with return callback number.    [name] BSN RN [facility] Care Management [phone]

## 2015-07-28 NOTE — Patient Outreach (Signed)
[facility]) Care Management  07/28/2015  Maryruth BunCarl L Sotelo 02/26/1931 [id number]   RN Health Coach  Return telephone call to patient. Hipaa compliance verified. Per patient he went to the Dr Gwen PoundsKowalski. Per patient then he some labs  drawn.  Per patient some of his medications was changed by the Dr. Gwen PoundsKowalski.  Patient has not had any more of the episodes during exercise.  Per patient he is only able to exercise 15 minutes on the elliptical rather than the 30 minutes  he had been doing. Patient is to see Dr Marcello FennelHande next week to go over the labs he had drawn on tuesday.  Per patient he received the letter RN Health Coach had sent but not the information on cold and flu or the additional supplement coupons.Patient agreed to follow up outreach call .  Assessment Vital signs 125/71 HR 69 Weight  Today 160 pounds Per patient he did make cardiologist aware of symptom Per patient lisinopril medication was changed  Plan  Resend information on cold and flu RN Health Coach will send coupons on ensure Follow up with patient within a month.  Gean MaidensFrances Torey Regan BSN RN [facility] Care Management [phone]

## 2015-08-26 ENCOUNTER — Other Ambulatory Visit: Payer: Self-pay | Admitting: *Deleted

## 2015-08-26 ENCOUNTER — Ambulatory Visit: Payer: Self-pay | Admitting: *Deleted

## 2015-08-26 NOTE — Patient Outreach (Signed)
[facility]) Care Management  08/26/2015  Joseph BunCarl L Wise [birth year] [id number]   RN Health Coach telephone call to patient.  Hipaa compliance verified. Patient is watching what he eats. He is having a lot of gas. He is only taking tums and over the counter medication.  Patient is still drinking ensure. He received the coupons for discount supplements RN Health Coach sent. Per patient he is going to schedule an appointment with a gastroenterologist.Per patient his is not having any swelling in feet. Still getting tired within a few minutes of exercising.  Patient goes to the exercise 4x wk..Per patient his  heart rated doesn't go over 100. Per patient he gets short of breath with exercise only. Blood Pressure is  is 141/72 Hr 63 today. Weight 163 today. Per patient the medication EIiquis is so expensive. Patient has agreed to referral to pharmacist for any possible discount. Patient has agreed to follow up outreach calls.   Assessment: Patient is having shortness of breath with exercising Patient is having difficulty affording the Eliquis Patient is reading EMMI information and able to discuss signs and symptoms of CHF and action plan Patient is still having some Gastrointestinal Problems Patient would continue to benefit from RN  Health Coach follow up calls and continued support of disease mangement of Congestive Heart failure Plan: Referral to pharmacy to check for any discounts available for Eliquis Send 2017 Calendar book Send patient coupons to help on cost of Ensure Follow up outreach call within a month Patient will continue to weigh, check BP and record daily Patient will continue to exercise 4x a week    Gean MaidensFrances Danner Paulding BSN RN [facility] Care Management [phone]

## 2015-08-27 ENCOUNTER — Other Ambulatory Visit: Payer: Self-pay | Admitting: Pharmacist

## 2015-08-27 NOTE — Patient Outreach (Signed)
Received a message from RN Health Coach Gean MaidensFrances Pleasant that Mr. [name] had spoken to her about his prescription for Eliquis being so expensive. Note that patient has the [employer] insurance and that Eliquis is a tier 3 medication per their online formulary list.   Called and spoke with Mr. [name] who reviews his receipts from his pharmacy and reports that he had been paying a $45 copay for his Eliquis each month, but that this last fill cost him $163.80. Discuss with patient the "donut hole" of coverage. Also discuss with Mr. [name] medication assistance options such as extra help and patient assistance from medication manufacturers. However, Mr. [name] states that he would not be eligible based on his income. Patient reports that he will not have quite enough medication to last him until the end of the year from this last fill. Advise patient to contact his Cardiologist's office to check to see if they might have some samples to give him for the couple of weeks that would get him to the end of the year. Discuss with patient the importance of adherence to this medication. Patient reports that he will call the Cardiologist office to see about the refills, but that if this option is not available, he is prepared to get it refilled one more time before the end of the year.  Provide patient again with my phone number in case he has questions in the future.  Duanne MoronElisabeth Horace Wishon, PharmD Clinical Pharmacist [facility] Care Management [phone]

## 2015-09-21 DIAGNOSIS — R197 Diarrhea, unspecified: Secondary | ICD-10-CM | POA: Diagnosis not present

## 2015-09-23 ENCOUNTER — Other Ambulatory Visit: Payer: Self-pay | Admitting: *Deleted

## 2015-09-23 ENCOUNTER — Ambulatory Visit: Payer: Self-pay | Admitting: *Deleted

## 2015-09-23 NOTE — Patient Outreach (Signed)
[facility]) Care Management  09/23/2015  [name] 1931-07-20 [id number]   RN Health Coach telephone call to patient.  Hipaa compliance verified. Pt went to the Physician and carried four stool specimens . Patient is having diarrhea. He usually goes twice a day or more.  It is with urgency. His diet consists mostly of grilled chicken and chicken soup.  Patient stated the diarrhea had been worse. Per patient he has not gotten the stool sample results back.  Per patient he stayed within his diet over the holidays.  Per patient he is out of the donut hole now. He is able to afford his medication. Patient is using ensure supplemental feedings. Patient weight today is 162. No swelling in lower extremities. Patient is exercising regularly at the gym. Per patient sometimes he feels real thirsty. Patient has agreed to  follow up outreach calls monthly. Assessment Patient drinks supplemental ensure for added nutrition Patient is still feeling fatigue with exercise Patient is awaiting stool sample results for diarrhea Patient is now able to afford medication now that he is out of the donut hole Patient will continue to benefit from out reach calls and support for congestive heart failure Plan  RN Health coach will call patient within month for follow outreach RN Health Coach will send patient EMMI on Heart Failure working with your Doctor RN Health Coach will encourage patient to continue to document weight and blood pressure daily RN will encourage patient to continue building up his time exercising RN Health Coach will send patient additional Ensure coupons when available RN Health Coach will send EMMI on Understanding water pills and thirst RN Health Coach will send EMMI on Heart Failure when to call you Doctor or 911 RN Health Coach will send EMMI on How to be salt salt RN Health will send a chart with food in high and low salt RN will follow up the results of the patient  stool specimen  [name] BSN RN [facility] Care Management [phone]

## 2015-10-21 ENCOUNTER — Other Ambulatory Visit: Payer: Self-pay | Admitting: *Deleted

## 2015-10-21 ENCOUNTER — Ambulatory Visit: Payer: Self-pay | Admitting: *Deleted

## 2015-10-22 ENCOUNTER — Encounter: Payer: Self-pay | Admitting: *Deleted

## 2015-10-22 NOTE — Patient Outreach (Addendum)
[facility]) Care Management  [place]  10/21/2015  [name] [birth year] [id number]  Subjective:  RN Health Coach telephone call to patient.  Hipaa compliance verified. Per patient he weighs 162 pounds.. Per patient he  is not seeing much change in his weight. Per patient he is not having any swelling in his lower extremities. Per patient he goes to the gym but his legs feels so weak. He is only able to work out for 10 minutes. Per patient his stool specimen  came back and it was positive for clostridium difficile. Patient didn't know that this can be transferable. Patient was using gel but not washing hands much. Patent stated he didn't know about washing hands. RN discussed the importance of taking all his medication as prescribed by the physician. Patient had reported that he had taken one of his medication  for the c-diff and it didn't do any good. RN explained to patient that he has to take it for a while. Patient was looking for quick results within  a couple of days. Patient has agreed to follow up outreach calls.     Objective:   Current Medications:  Current Outpatient Prescriptions  Medication Sig Dispense Refill  . Alpha-D-Galactosidase (BEANO) TABS Take 1 tablet by mouth.    Marland Kitchen amiodarone (PACERONE) 400 MG tablet Take 400 mg by mouth 2 (two) times daily.     Marland Kitchen ELIQUIS 5 MG TABS tablet Take 5 mg by mouth 2 (two) times daily.  11  . esomeprazole (NEXIUM) 20 MG capsule Take 20 mg by mouth daily at 12 noon.    . furosemide (LASIX) 40 MG tablet Take 1 tablet (40 mg total) by mouth daily. 30 tablet 5  . lisinopril (PRINIVIL,ZESTRIL) 5 MG tablet Take by mouth.    . metoprolol tartrate (LOPRESSOR) 25 MG tablet Take 1 tablet (25 mg total) by mouth 2 (two) times daily.    . Multiple Vitamin (MULTI-VITAMINS) TABS Take 1 tablet by mouth daily.    . nitroGLYCERIN (NITROSTAT) 0.4 MG SL tablet Place 0.4 mg under the tongue every 5 (five) minutes x 3 doses as  needed. For chest pain. If no relief call MD or go to emergency room.    Marland Kitchen omeprazole (PRILOSEC) 20 MG capsule Take 20 mg by mouth daily.    . pravastatin (PRAVACHOL) 10 MG tablet Take 10 mg by mouth daily.    . vitamin E 400 UNIT capsule Take 400 Units by mouth daily.     No current facility-administered medications for this visit.    Functional Status:  In your present state of health, do you have any difficulty performing the following activities: 10/21/2015 05/19/2015  Hearing? N N  Vision? N N  Difficulty concentrating or making decisions? N N  Walking or climbing stairs? Y Y  Dressing or bathing? N N  Doing errands, shopping? N N  Preparing Food and eating ? N N  Using the Toilet? N N  In the past six months, have you accidently leaked urine? N N  Do you have problems with loss of bowel control? N N  Managing your Medications? N N  Managing your Finances? - N  Housekeeping or managing your Housekeeping? N N    Fall/Depression Screening: PHQ 2/9 Scores 10/21/2015 07/19/2015 06/18/2015 05/19/2015 05/17/2015  PHQ - 2 Score _0 Assessment:  Knowledge deficit on Clostridium Difficile Knowledge deficit on infection control around the home Patient would benefit from  RN Health Coach follow education and support  [facility] CM Care Plan Problem One        Most Recent Value   [facility] CM Short Term Goal #3 Start Date  -- [not met. Patient is trying to exercise on a regular basis but stated his legs feels so weak so he is only able to work out for 10 minutes]    [facility] CM Care Plan Problem Two        Most Recent Value   Care Plan Problem Two  knowledge deficit about self care with C-diff   Role Documenting the Problem [place] for Problem Two  Active   Interventions for Problem Two [place] discussed with patient what c-Diff is.RN sent patient information on frequently asked questions about c-diff.  RN Health Coach sent educational material on  c-diff stool testing.   [facility] Long Term Goal (31-90) days  Patient will be able to verbalize that stools are negative for clostridium difficile within 60 days   [facility] Long Term Goal Start Date  10/22/15   [facility] CM Short Term Goal #1 (0-30 days)  Patient will be able to verbalize that he is using infection control techniques in the home   [facility] CM Short Term Goal #1 Start Date  10/22/15   Interventions for Short Term Goal #2   RN Health Coach discussed infection control precautions in the home. RN explained that with C-Diff it is better to wash hands with hot soapy water than just to use gel. RN sent information on handwashing,        Plan:  RN will send patient educational material on Clostridium Difficile Patient will be able to verbalize infection control techniques he is using in his home [place] will follow up within a month. RN made sure patient has office number if any questions    Johny Shock, BSN, [place] Management RN Health Coach Phone: [place] complies with applicable Federal civil rights laws and does not discriminate on the basis of race, color, national origin, age, disability, or sex. Espaol (Spanish)  [place] cumple con las leyes federales de derechos civiles aplicables y no discrimina por motivos de raza, color, nacionalidad, edad, discapacidad o sexo.    [place]  [place] tun th? lu?t dn quy?n hi?n hnh c?a Lin bang v khng phn bi?t ?i x? d?a trn ch?ng t?c, mu da, ngu?n g?c qu?c gia, ? tu?i, khuy?t t?t, ho?c gi?i tnh.    (Arabic)    [place]                      .

## 2015-10-27 DIAGNOSIS — K861 Other chronic pancreatitis: Secondary | ICD-10-CM | POA: Diagnosis not present

## 2015-10-27 DIAGNOSIS — Z7901 Long term (current) use of anticoagulants: Secondary | ICD-10-CM | POA: Diagnosis not present

## 2015-10-27 DIAGNOSIS — R197 Diarrhea, unspecified: Secondary | ICD-10-CM | POA: Diagnosis not present

## 2015-10-27 DIAGNOSIS — I2510 Atherosclerotic heart disease of native coronary artery without angina pectoris: Secondary | ICD-10-CM | POA: Diagnosis not present

## 2015-10-27 DIAGNOSIS — I480 Paroxysmal atrial fibrillation: Secondary | ICD-10-CM | POA: Diagnosis not present

## 2015-10-27 DIAGNOSIS — E782 Mixed hyperlipidemia: Secondary | ICD-10-CM | POA: Diagnosis not present

## 2015-10-27 DIAGNOSIS — A047 Enterocolitis due to Clostridium difficile: Secondary | ICD-10-CM | POA: Diagnosis not present

## 2015-10-27 DIAGNOSIS — Z1389 Encounter for screening for other disorder: Secondary | ICD-10-CM | POA: Diagnosis not present

## 2015-10-27 DIAGNOSIS — I10 Essential (primary) hypertension: Secondary | ICD-10-CM | POA: Diagnosis not present

## 2015-10-28 DIAGNOSIS — A047 Enterocolitis due to Clostridium difficile: Secondary | ICD-10-CM | POA: Diagnosis not present

## 2015-10-28 DIAGNOSIS — R197 Diarrhea, unspecified: Secondary | ICD-10-CM | POA: Diagnosis not present

## 2015-10-28 DIAGNOSIS — K861 Other chronic pancreatitis: Secondary | ICD-10-CM | POA: Diagnosis not present

## 2015-11-17 ENCOUNTER — Other Ambulatory Visit: Payer: Self-pay | Admitting: *Deleted

## 2015-11-17 ENCOUNTER — Ambulatory Visit: Payer: Self-pay | Admitting: *Deleted

## 2015-11-17 NOTE — Patient Outreach (Signed)
[facility]) Care Management  11/17/2015  [name] 1931/04/09 [id number]  RN Health Coach  attempted Follow up outreach call to patient.  Patient was unavailable. HIPPA compliance voicemail message was left with return callback number.    [name], BSN, RN [facility] Care Management RN Health Coach Phone: [phone] [facility] complies with applicable Federal civil rights laws and does not discriminate on the basis of race, color, national origin, age, disability, or sex. Espaol (Spanish)  [facility] Customer service manager cumple con las leyes federales de derechos civiles aplicables y no discrimina por motivos de raza, color, nacionalidad, edad, discapacidad o sexo.    [place]  [facility] Art gallery manager th? lu?t dn quy?n hi?n hnh c?a Lin bang v khng phn bi?t ?i x? d?a trn ch?ng t?c, mu da, ngu?n g?c qu?c gia, ? tu?i, khuy?t t?t, ho?c gi?i tnh.    (Arabic)    [facility] Customer service manager                      .

## 2015-11-24 DIAGNOSIS — I2510 Atherosclerotic heart disease of native coronary artery without angina pectoris: Secondary | ICD-10-CM | POA: Diagnosis not present

## 2015-11-24 DIAGNOSIS — K861 Other chronic pancreatitis: Secondary | ICD-10-CM | POA: Diagnosis not present

## 2015-11-24 DIAGNOSIS — Z23 Encounter for immunization: Secondary | ICD-10-CM | POA: Diagnosis not present

## 2015-11-24 DIAGNOSIS — I480 Paroxysmal atrial fibrillation: Secondary | ICD-10-CM | POA: Diagnosis not present

## 2015-11-24 DIAGNOSIS — Z0000 Encounter for general adult medical examination without abnormal findings: Secondary | ICD-10-CM | POA: Diagnosis not present

## 2015-11-24 DIAGNOSIS — Z1389 Encounter for screening for other disorder: Secondary | ICD-10-CM | POA: Diagnosis not present

## 2015-11-24 DIAGNOSIS — A047 Enterocolitis due to Clostridium difficile: Secondary | ICD-10-CM | POA: Diagnosis not present

## 2015-11-24 DIAGNOSIS — I10 Essential (primary) hypertension: Secondary | ICD-10-CM | POA: Diagnosis not present

## 2015-11-24 DIAGNOSIS — E782 Mixed hyperlipidemia: Secondary | ICD-10-CM | POA: Diagnosis not present

## 2015-11-25 ENCOUNTER — Encounter: Payer: Self-pay | Admitting: *Deleted

## 2015-11-25 NOTE — Patient Outreach (Signed)
[facility]) Care Management  [place]  11/17/2015  ZACARI STIFF 11-06-1930 [id number]  Subjective: RN Health Coach telephone call to patient.  Hipaa compliance verified. Per patient he is still having some endurance problems with exercising. Per patient he is able to exercise a total of 20-30 minutes. His knee gets weak feeling especially in the evenings. Per patient he has lost 3 pounds. His current weight is 159 pounds. Per patient he is using supplemental nutrition.  Patient reported his BP os 135/68 pulse 76.  He states overall he feels pretty good for an 82 year old man. Per patient he is not having any swelling in his lower extremities. His shortness of breath and wekness is only after moderate exercise.    Objective:   Current Medications:  Current Outpatient Prescriptions  Medication Sig Dispense Refill  . Alpha-D-Galactosidase (BEANO) TABS Take 1 tablet by mouth.    Marland Kitchen amiodarone (PACERONE) 400 MG tablet Take 400 mg by mouth 2 (two) times daily.     Marland Kitchen ELIQUIS 5 MG TABS tablet Take 5 mg by mouth 2 (two) times daily.  11  . esomeprazole (NEXIUM) 20 MG capsule Take 20 mg by mouth daily at 12 noon.    . furosemide (LASIX) 40 MG tablet Take 1 tablet (40 mg total) by mouth daily. 30 tablet 5  . lisinopril (PRINIVIL,ZESTRIL) 5 MG tablet Take by mouth.    . metoprolol tartrate (LOPRESSOR) 25 MG tablet Take 1 tablet (25 mg total) by mouth 2 (two) times daily.    . Multiple Vitamin (MULTI-VITAMINS) TABS Take 1 tablet by mouth daily.    . nitroGLYCERIN (NITROSTAT) 0.4 MG SL tablet Place 0.4 mg under the tongue every 5 (five) minutes x 3 doses as needed. For chest pain. If no relief call MD or go to emergency room.    . pravastatin (PRAVACHOL) 10 MG tablet Take 10 mg by mouth daily.    . vitamin E 400 UNIT capsule Take 400 Units by mouth daily.    Marland Kitchen omeprazole (PRILOSEC) 20 MG capsule Take 20 mg by mouth daily. Reported on 11/25/2015     No current  facility-administered medications for this visit.    Functional Status:  In your present state of health, do you have any difficulty performing the following activities: 11/17/2015 10/21/2015  Hearing? N N  Vision? N N  Difficulty concentrating or making decisions? N N  Walking or climbing stairs? Y Y  Dressing or bathing? N N  Doing errands, shopping? N N  Preparing Food and eating ? - N  Using the Toilet? - N  In the past six months, have you accidently leaked urine? - N  Do you have problems with loss of bowel control? - N  Managing your Medications? - N  Managing your Finances? - -  Housekeeping or managing your Housekeeping? - N    Fall/Depression Screening: PHQ 2/9 Scores 11/17/2015 10/21/2015 07/19/2015 06/18/2015 05/19/2015 05/17/2015  PHQ - 2 Score 1 1 1 1 1 1    THN CM Care Plan Problem One        Most Recent Value   Care Plan Problem One  Knowledge deficit related to self management of congestive heart failure   Role Documenting the Problem One  [place] for Problem One  Active   THN Long Term Goal (31-90 days)  Patient will  maintain weight within the next 30 days   THN Long Term Goal Start Date  11/17/15   Interventions for Problem One Long Term Goal  Patient will drink additional nutritional supplements to maintain body weight. RN health coach sent patient educational information on Heart healthy diet      THN CM Short Term Goal #1 (0-30 days)  Patient will be able to continue to state zones and action plan of congestive heart failure   THN CM Short Term Goal #1 Start Date  11/17/15   Interventions for Short Term Goal #1  RNCM will  continue to send  EMMI educational material on congestive heart failure. RNCM will continue to discuss signs and symptoms of CHF.  RNCM discussed Zones of congestive heart failure   THN CM Short Term Goal #2 (0-30 days)  Patient will continue to weigh daily within the next 30 days and record   THN CM Short Term Goal #2 Start Date   11/17/15   Interventions for Short Term Goal #2  RNCM discussed the importance of daily weighing and recording. RNCM discussed any weight gain of 3 pounds or more in a day or 5 pounds in a week. RNCM discussed with patient when to notify the physician. Patient has a calendar book to record daily weights. RN will also be monitoring for excessive weight loss.   THN CM Short Term Goal #3 (0-30 days)  Patient will continue to exercise on a regular basis to increase endurance   THN CM Short Term Goal #3 Start Date  11/17/15   Interventions for Short Tern Goal #3  Discussed with patient tips on increasing his exercise. Discussed with patient about talking with his cardiologist again. Talked with patient about getting into a cardiac exercise program to slowly build  up his strength.    THN CM Care Plan Problem Two        Most Recent Value   THN CM Short Term Goal #1 Met Date   11/17/15 [all final stools came back negative]      Assessment:  Patient will continue to benefit from [place] telephonic outreach for education   Plan:  RN will send patient Educational information on Heart-Healthy Rating Plan RN will sent patient information on High Protein and High Calorie Diet RN will follow up outreach within a month  Johny Shock, BSN, [place] Management RN Health Coach Phone: [place] complies with applicable Federal civil rights laws and does not discriminate on the basis of race, color, national origin, age, disability, or sex. Espaol (Spanish)  [place] cumple con las leyes federales de derechos civiles aplicables y no discrimina por motivos de raza, color, nacionalidad, edad, discapacidad o sexo.    [place]  [place] tun th? lu?t dn quy?n hi?n hnh c?a Lin bang v khng phn bi?t ?i x? d?a trn ch?ng t?c, mu da, ngu?n g?c qu?c gia, ? tu?i, khuy?t t?t, ho?c gi?i tnh.    (Arabic)     [place]                      .

## 2015-12-01 DIAGNOSIS — I2510 Atherosclerotic heart disease of native coronary artery without angina pectoris: Secondary | ICD-10-CM | POA: Diagnosis not present

## 2015-12-01 DIAGNOSIS — I5022 Chronic systolic (congestive) heart failure: Secondary | ICD-10-CM | POA: Diagnosis not present

## 2015-12-01 DIAGNOSIS — E782 Mixed hyperlipidemia: Secondary | ICD-10-CM | POA: Diagnosis not present

## 2015-12-01 DIAGNOSIS — I480 Paroxysmal atrial fibrillation: Secondary | ICD-10-CM | POA: Diagnosis not present

## 2015-12-17 ENCOUNTER — Ambulatory Visit: Payer: Self-pay | Admitting: *Deleted

## 2015-12-19 ENCOUNTER — Emergency Department
Admission: EM | Admit: 2015-12-19 | Discharge: 2015-12-19 | Disposition: A | Discharge status: Home / Self Care | Payer: PPO | Attending: Emergency Medicine | Admitting: Emergency Medicine

## 2015-12-19 ENCOUNTER — Encounter: Payer: Self-pay | Admitting: Emergency Medicine

## 2015-12-19 ENCOUNTER — Emergency Department: Payer: PPO

## 2015-12-19 DIAGNOSIS — I10 Essential (primary) hypertension: Secondary | ICD-10-CM | POA: Insufficient documentation

## 2015-12-19 DIAGNOSIS — R1030 Lower abdominal pain, unspecified: Secondary | ICD-10-CM

## 2015-12-19 DIAGNOSIS — Z79899 Other long term (current) drug therapy: Secondary | ICD-10-CM | POA: Diagnosis not present

## 2015-12-19 DIAGNOSIS — Z87891 Personal history of nicotine dependence: Secondary | ICD-10-CM | POA: Insufficient documentation

## 2015-12-19 DIAGNOSIS — K8020 Calculus of gallbladder without cholecystitis without obstruction: Secondary | ICD-10-CM | POA: Diagnosis not present

## 2015-12-19 LAB — COMPREHENSIVE METABOLIC PANEL
ALT: 21 U/L (ref 17–63)
AST: 29 U/L (ref 15–41)
Albumin: 3.6 g/dL (ref 3.5–5.0)
Alkaline Phosphatase: 59 U/L (ref 38–126)
Anion gap: 6 (ref 5–15)
BUN: 26 mg/dL — AB (ref 6–20)
CHLORIDE: 102 mmol/L (ref 101–111)
CO2: 28 mmol/L (ref 22–32)
Calcium: 9 mg/dL (ref 8.9–10.3)
Creatinine, Ser: 1.22 mg/dL (ref 0.61–1.24)
GFR, EST NON AFRICAN AMERICAN: 53 mL/min — AB (ref >60)
Glucose, Bld: 172 mg/dL — ABNORMAL HIGH (ref 65–99)
POTASSIUM: 4.1 mmol/L (ref 3.5–5.1)
Sodium: 136 mmol/L (ref 135–145)
Total Bilirubin: 0.5 mg/dL (ref 0.3–1.2)
Total Protein: 6.8 g/dL (ref 6.5–8.1)

## 2015-12-19 LAB — CBC WITH DIFFERENTIAL/PLATELET
BASOS ABS: 0.1 10*3/uL (ref 0–0.1)
Basophils Relative: 1 %
Eosinophils Absolute: 0.2 10*3/uL (ref 0–0.7)
Eosinophils Relative: 2 %
HEMATOCRIT: 37.8 % — AB (ref 40.0–52.0)
Hemoglobin: 13 g/dL (ref 13.0–18.0)
Lymphocytes Relative: 16 %
Lymphs Abs: 1.4 10*3/uL (ref 1.0–3.6)
MCH: 33.2 pg (ref 26.0–34.0)
MCHC: 34.5 g/dL (ref 32.0–36.0)
MCV: 96.1 fL (ref 80.0–100.0)
MONO ABS: 0.8 10*3/uL (ref 0.2–1.0)
MONOS PCT: 9 %
NEUTROS ABS: 6.2 10*3/uL (ref 1.4–6.5)
Neutrophils Relative %: 72 %
Platelets: 222 10*3/uL (ref 150–440)
RBC: 3.93 MIL/uL — ABNORMAL LOW (ref 4.40–5.90)
RDW: 12.7 % (ref 11.5–14.5)
WBC: 8.6 10*3/uL (ref 3.8–10.6)

## 2015-12-19 LAB — TROPONIN I

## 2015-12-19 LAB — URINALYSIS COMPLETE WITH MICROSCOPIC (ARMC ONLY)
BILIRUBIN URINE: NEGATIVE
Bacteria, UA: NONE SEEN
GLUCOSE, UA: 50 mg/dL — AB
HGB URINE DIPSTICK: NEGATIVE
LEUKOCYTES UA: NEGATIVE
NITRITE: NEGATIVE
Protein, ur: NEGATIVE mg/dL
SPECIFIC GRAVITY, URINE: 1.02 (ref 1.005–1.030)
Squamous Epithelial / LPF: NONE SEEN
pH: 7 (ref 5.0–8.0)

## 2015-12-19 LAB — LIPASE, BLOOD: LIPASE: 91 U/L — AB (ref 11–51)

## 2015-12-19 MED ORDER — SODIUM CHLORIDE 0.9 % IV BOLUS (SEPSIS)
500.0000 mL | Freq: Once | INTRAVENOUS | Status: AC
  Administered 2015-12-19: 500 mL via INTRAVENOUS

## 2015-12-19 MED ORDER — MORPHINE SULFATE (PF) 2 MG/ML IV SOLN
2.0000 mg | Freq: Once | INTRAVENOUS | Status: AC
  Administered 2015-12-19: 2 mg via INTRAVENOUS
  Filled 2015-12-19: qty 1

## 2015-12-19 MED ORDER — ONDANSETRON HCL 4 MG/2ML IJ SOLN
4.0000 mg | Freq: Once | INTRAMUSCULAR | Status: AC
  Administered 2015-12-19: 4 mg via INTRAVENOUS
  Filled 2015-12-19: qty 2

## 2015-12-19 NOTE — ED Notes (Signed)
Patient with generalized abd pain that started about 2 hours ago. Patient denies nausea, vomiting or diarrhea.

## 2015-12-19 NOTE — ED Notes (Signed)
Pt to CT via stretcher

## 2015-12-19 NOTE — ED Provider Notes (Signed)
[facility] Emergency Department Provider Note  ____________________________________________  Time seen: Approximately 2:23 AM  I have reviewed the triage vital signs and the nursing notes.   HISTORY  Chief Complaint Abdominal Pain    HPI [name] is a 82 y.o. male who presents to the ED from home with a chief complaint of lower abdominal pain. Patient reports sharp "gas" pain which awoke him from sleep approximately 11 PM. Reports pain is nonradiating and not associated with nausea, vomiting, dysuria or diarrhea. Last bowel movement prior to sleep which was normal for him. Denies recent fever, chills, chest pain, shortness of breath. Denies associated testicular swelling or tenderness. Denies recent travel or trauma. Nothing makes his pain better or worse.Denies prior history of kidney stones, colitis or diverticulitis.   Past Medical History  Diagnosis Date  . Hypertension   . Atrial fibrillation (HCC)   . MI, old   . CAD (coronary artery disease)   . Pancreatitis   . GERD (gastroesophageal reflux disease)   . Anemia   . Chronic systolic CHF (congestive heart failure) (HCC)   . Hyperlipemia   . Pancreatitis   . Mitral valve disorder   . Hemorrhoids   . Ventricular tachycardia [facility](HCC)     Patient Active Problem List   Diagnosis Date Noted  . Syncope and collapse 04/14/2015  . Atrial fibrillation (HCC) 04/14/2015  . CHF (congestive heart failure) (HCC) 03/22/2015  . Acute respiratory failure (HCC) 03/22/2015  . HTN (hypertension) 03/22/2015    Past Surgical History  Procedure Laterality Date  . Cardiac catheterization    . Electrophysiologic study N/A 04/19/2015    Procedure: Cardioversion;  Surgeon: Lamar BlinksBruce J Kowalski, MD;  Location: [facility] ORS;  Service: Cardiovascular;  Laterality: N/A;    Current Outpatient Rx  Name  Route  Sig  Dispense  Refill  . Alpha-D-Galactosidase (BEANO) TABS   Oral   Take 1 tablet by mouth.         Everlene Balls.  ELIQUIS 5 MG TABS tablet   Oral   Take 5 mg by mouth 2 (two) times daily.      11     Dispense as written.   . furosemide (LASIX) 40 MG tablet   Oral   Take 1 tablet (40 mg total) by mouth daily.   30 tablet   5   . lisinopril (PRINIVIL,ZESTRIL) 5 MG tablet   Oral   Take by mouth.         . metoprolol tartrate (LOPRESSOR) 25 MG tablet   Oral   Take 1 tablet (25 mg total) by mouth 2 (two) times daily.         . Multiple Vitamin (MULTI-VITAMINS) TABS   Oral   Take 1 tablet by mouth daily.         . nitroGLYCERIN (NITROSTAT) 0.4 MG SL tablet   Sublingual   Place 0.4 mg under the tongue every 5 (five) minutes x 3 doses as needed. For chest pain. If no relief call MD or go to emergency room.         . vitamin E 400 UNIT capsule   Oral   Take 400 Units by mouth daily.           Allergies Lipitor  Family History  Problem Relation Age of Onset  . Hypertension Father   . Hyperlipidemia Father   . Heart disease Father   . Heart disease Mother   . Hypertension Son     Social  History Social History  Substance Use Topics  . Smoking status: Former Smoker -- 1.00 packs/day for 32 years    Quit date: 04/18/1984  . Smokeless tobacco: None  . Alcohol Use: No    Review of Systems  Constitutional: No fever/chills. Eyes: No visual changes. ENT: No sore throat. Cardiovascular: Denies chest pain. Respiratory: Denies shortness of breath. Gastrointestinal: Positive for abdominal pain.  No nausea, no vomiting.  No diarrhea.  No constipation. Genitourinary: Negative for dysuria. Musculoskeletal: Negative for back pain. Skin: Negative for rash. Neurological: Negative for headaches, focal weakness or numbness.  10-point ROS otherwise negative.  ____________________________________________   PHYSICAL EXAM:  VITAL SIGNS: ED Triage Vitals  Enc Vitals Group     BP 12/19/15 0121 126/77 mmHg     Pulse Rate 12/19/15 0121 76     Resp 12/19/15 0121 22     Temp  12/19/15 0121 97.6 F (36.4 C)     Temp src --      SpO2 12/19/15 0121 100 %     Weight 12/19/15 0121 162 lb (73.483 kg)     Height 12/19/15 0121  (1.778 m)     Head Cir --      Peak Flow --      Pain Score 12/19/15 0122 9     Pain Loc --      Pain Edu? --      Excl. in GC? --     Constitutional: Alert and oriented. Well appearing and in no acute distress. Eyes: Conjunctivae are normal. PERRL. EOMI. Head: Atraumatic. Nose: No congestion/rhinnorhea. Mouth/Throat: Mucous membranes are moist.  Oropharynx non-erythematous. Neck: No stridor.   Cardiovascular: Normal rate, regular rhythm. Grossly normal heart sounds.  Good peripheral circulation. Respiratory: Normal respiratory effort.  No retractions. Lungs CTAB. Gastrointestinal: Soft and nontender. No distention. No abdominal bruits. No CVA tenderness. Musculoskeletal: No lower extremity tenderness nor edema.  No joint effusions. Neurologic:  Normal speech and language. No gross focal neurologic deficits are appreciated. No gait instability. Skin:  Skin is warm, dry and intact. No rash noted. Psychiatric: Mood and affect are normal. Speech and behavior are normal.  ____________________________________________   LABS (all labs ordered are listed, but only abnormal results are displayed)  Labs Reviewed  CBC WITH DIFFERENTIAL/PLATELET - Abnormal; Notable for the following:    RBC 3.93 (*)    HCT 37.8 (*)    All other components within normal limits  COMPREHENSIVE METABOLIC PANEL - Abnormal; Notable for the following:    Glucose, Bld 172 (*)    BUN 26 (*)    GFR calc non Af Amer 53 (*)    All other components within normal limits  LIPASE, BLOOD - Abnormal; Notable for the following:    Lipase 91 (*)    All other components within normal limits  URINALYSIS COMPLETEWITH MICROSCOPIC ([facility] ONLY) - Abnormal; Notable for the following:    Color, Urine YELLOW (*)    APPearance CLEAR (*)    Glucose, UA 50 (*)    Ketones, ur  TRACE (*)    All other components within normal limits  TROPONIN I   ____________________________________________  EKG  ED ECG REPORT I, Kareen Hitsman J, the attending physician, personally viewed and interpreted this ECG.   Date: 12/19/2015  EKG Time: 0137  Rate: 64  Rhythm: normal EKG, normal sinus rhythm  Axis: Normal  Intervals:none  ST&T Change: Nonspecific  ____________________________________________  RADIOLOGY  CT abdomen/pelvis interpreted per Dr. Cherly Hensen: 1. Dilatation of the pancreatic  duct to 1.0 cm in maximal diameter, significantly increased from the prior study. Pancreatic head mass cannot be excluded. MRCP would be helpful for further evaluation, when and as deemed clinically appropriate. 2. Scattered small calcified nodes seen adjacent to the vasculature at the mesentery the mid abdomen, of uncertain significance. These could reflect remote sequelae of infection. 3. Cholelithiasis. Gallbladder otherwise unremarkable. 4. Scattered left renal parapelvic cysts noted. 5. Scattered calcification along the abdominal aorta and its branches. 6. Scattered diverticulosis along the sigmoid colon, without evidence of diverticulitis. ____________________________________________   PROCEDURES  Procedure(s) performed: None  Critical Care performed: No  ____________________________________________   INITIAL IMPRESSION / ASSESSMENT AND PLAN / ED COURSE  Pertinent labs & imaging results that were available during my care of the patient were reviewed by me and considered in my medical decision making (see chart for details).  82 year old male who presents with sudden onset suprapubic pain. Initial laboratory results notable for mildly elevated lipase; patient with a history of pancreatitis. Examine patient after IV morphine was administered and pain is currently down to a 3/10. Given sudden onset nature of lower abdominal pain, will obtain a noncontrast CT  abdomen/pelvis.  ----------------------------------------- 3:57 AM on 12/19/2015 -----------------------------------------  Patient resting in no acute distress. Ambulating with steady gait to restroom to provide urine specimen. Updated patient and son on CT imaging results.  ----------------------------------------- 4:36 AM on 12/19/2015 -----------------------------------------  Updated patient and son of urinalysis results and minimally elevated lipase. Patient is resting comfortably in no acute distress. Strict return precautions given. Both verbalize understanding and agree with plan of care.        ____________________________________________   FINAL CLINICAL IMPRESSION(S) / ED DIAGNOSES  Final diagnoses:  Lower abdominal pain      Irean Hong, MD 12/19/15 [phone]

## 2015-12-19 NOTE — Discharge Instructions (Signed)
Return to the ER for worsening symptoms, persistent vomiting, difficulty breathing or other concerns. ° °Abdominal Pain, Adult °Many things can cause abdominal pain. Usually, abdominal pain is not caused by a disease and will improve without treatment. It can often be observed and treated at home. Your health care provider will do a physical exam and possibly order blood tests and X-rays to help determine the seriousness of your pain. However, in many cases, more time must pass before a clear cause of the pain can be found. Before that point, your health care provider may not know if you need more testing or further treatment. °HOME CARE INSTRUCTIONS °Monitor your abdominal pain for any changes. The following actions may help to alleviate any discomfort you are experiencing: °· Only take over-the-counter or prescription medicines as directed by your health care provider. °· Do not take laxatives unless directed to do so by your health care provider. °· Try a clear liquid diet (broth, tea, or water) as directed by your health care provider. Slowly move to a bland diet as tolerated. °SEEK MEDICAL CARE IF: °· You have unexplained abdominal pain. °· You have abdominal pain associated with nausea or diarrhea. °· You have pain when you urinate or have a bowel movement. °· You experience abdominal pain that wakes you in the night. °· You have abdominal pain that is worsened or improved by eating food. °· You have abdominal pain that is worsened with eating fatty foods. °· You have a fever. °SEEK IMMEDIATE MEDICAL CARE IF: °· Your pain does not go away within 2 hours. °· You keep throwing up (vomiting). °· Your pain is felt only in portions of the abdomen, such as the right side or the left lower portion of the abdomen. °· You pass bloody or black tarry stools. °MAKE SURE YOU: °· Understand these instructions. °· Will watch your condition. °· Will get help right away if you are not doing well or get worse. °  °This  information is not intended to replace advice given to you by your health care provider. Make sure you discuss any questions you have with your health care provider. °  °Document Released: 06/14/2005 Document Revised: 05/26/2015 Document Reviewed: 05/14/2013 °Elsevier Interactive Patient Education ©2016 Elsevier Inc. ° °

## 2015-12-22 ENCOUNTER — Encounter: Payer: Self-pay | Admitting: *Deleted

## 2015-12-22 ENCOUNTER — Other Ambulatory Visit: Payer: Self-pay | Admitting: *Deleted

## 2015-12-22 ENCOUNTER — Ambulatory Visit: Payer: Self-pay | Admitting: *Deleted

## 2015-12-22 NOTE — Patient Outreach (Signed)
[facility]) Care Management  [facility]  12/22/2015   [name] 1931-05-24 [id number]  Subjective: RN Health Coach received telephone from  patient.  Hipaa compliance verified. Per patient she went to the ED on the weekend. Per patient he was having abdominal pain. Patient is still having some shortness of breath with moderate exercise. Per patient he is still unable to build his stamina up while exercising. Patient states he is in the green zone. No swelling in extremities. No coughing  or weight gain. No shortness of breath or trouble breathing with minimal  activity.  Patient states he is eating good but patient noted that the severe abdominal pain occurred  @ 2 hours after eating. Per patient that is when his pain usually starts. Patient is follow up with his physician next week. RN encouraged patient to tell the Dr the above information.  Patient has agreed to follow up outreach calls.   Objective:   Encounter Medications:  Outpatient Encounter Prescriptions as of 12/22/2015  Medication Sig Note  . Alpha-D-Galactosidase (BEANO) TABS Take 1 tablet by mouth.   Everlene Balls. ELIQUIS 5 MG TABS tablet Take 5 mg by mouth 2 (two) times daily. 04/14/2015: -  . furosemide (LASIX) 40 MG tablet Take 1 tablet (40 mg total) by mouth daily.   Marland Kitchen. lisinopril (PRINIVIL,ZESTRIL) 5 MG tablet Take by mouth. 05/17/2015: [facility]  . metoprolol tartrate (LOPRESSOR) 25 MG tablet Take 1 tablet (25 mg total) by mouth 2 (two) times daily.   . Multiple Vitamin (MULTI-VITAMINS) TABS Take 1 tablet by mouth daily. 04/14/2015: -  . nitroGLYCERIN (NITROSTAT) 0.4 MG SL tablet Place 0.4 mg under the tongue every 5 (five) minutes x 3 doses as needed. For chest pain. If no relief call MD or go to emergency room. 04/14/2015: -  . vitamin E 400 UNIT capsule Take 400 Units by mouth daily. 06/18/2015: Patient reports taking 200 units daily   No facility-administered encounter  medications on file as of 12/22/2015.    Functional Status:  In your present state of health, do you have any difficulty performing the following activities: 12/22/2015 11/17/2015  Hearing? N N  Vision? N N  Difficulty concentrating or making decisions? N N  Walking or climbing stairs? Y Y  Dressing or bathing? N N  Doing errands, shopping? N N  Preparing Food and eating ? - -  Using the Toilet? - -  In the past six months, have you accidently leaked urine? - -  Do you have problems with loss of bowel control? - -  Managing your Medications? - -  Managing your Finances? - -  Housekeeping or managing your Housekeeping? - -    Fall/Depression Screening: PHQ 2/9 Scores 12/22/2015 11/17/2015 10/21/2015 07/19/2015 06/18/2015 05/19/2015 05/17/2015  PHQ - 2 Score 1 1 1 1 1 1 1    THN CM Care Plan Problem One        Most Recent Value   Care Plan Problem One  Knowledge deficit related to self management of congestive heart failure   Role Documenting the Problem One  Health Coach   Care Plan for Problem One  Active   THN Long Term Goal (31-90 days)  Patient will  maintain weight within the next 30 days   THN Long Term Goal Start Date  12/22/15 [facility][continue ]   Interventions for Problem One Long Term Goal  Patient will drink additional nutritional supplements to maintain body weight. RN health coach  sent patient educational information on Heart healthy diet      THN CM Short Term Goal #1 (0-30 days)  Patient will to continue to state zones and action plan of congestive heart failure within the next 30 days   THN CM Short Term Goal #1 Start Date  12/22/15 [patient in green zone . Will continue to encourage patient to identify zones each follow up]   Interventions for Short Term Goal #1  RNCM sent  EMMI educational material on congestive heart failure. RNCM will continue to discuss signs and symptoms of CHF.  RNCM discussed Zones of congestive heart failure   THN CM Short Term Goal #2 (0-30 days)  Patient will  continue to weigh daily within the next 30 days and record   THN CM Short Term Goal #2 Start Date  12/22/15 [continue to reiterate]   Interventions for Short Term Goal #2  RNCM discussed the importance of daily weighing and recording. RNCM discussed any weight gain of 3 pounds or more in a day or 5 pounds in a week. RNCM discussed with patient when to notify the physician. Patient has a calendar book to record daily weights. RN will also be monitoring for excessive weight loss.   THN CM Short Term Goal #3 (0-30 days)  Patient will continue to exercise on a regular basis to increase endurance   THN CM Short Term Goal #3 Start Date  12/22/15 [continue]   Interventions for Short Tern Goal #3  Discussed with patient tips on increasing his exercise. Discussed with patient about talking with his cardiologist again. Talked with patient about getting into a cardiac exercise program to slowly build  up his strength.      Assessment:  Patient will continue to benefit from [employer] telephonic outreach for education and support for diabetes self management  Plan:  RN will continue to send patient supplemental nutritional coupons RN will send patient education information on low fat and cholesterol diet RN will send patient educational information on Dash diet RN will send patient educational information on  Getting active RN will send patient educational information on Building up your strength RN will send patient educational information on Sticking with it RN will send patient educational information on Tips to be smart when you exercise RN will send patient educational information on Overcoming roadblock when you exercise RN will follow up with patient within a month  [name] BSN RN [facility] Care Management [phone]

## 2015-12-29 DIAGNOSIS — R197 Diarrhea, unspecified: Secondary | ICD-10-CM | POA: Diagnosis not present

## 2015-12-29 DIAGNOSIS — K861 Other chronic pancreatitis: Secondary | ICD-10-CM | POA: Diagnosis not present

## 2016-01-20 ENCOUNTER — Ambulatory Visit: Payer: Self-pay | Admitting: *Deleted

## 2016-01-20 ENCOUNTER — Other Ambulatory Visit: Payer: Self-pay | Admitting: *Deleted

## 2016-01-20 ENCOUNTER — Encounter: Payer: Self-pay | Admitting: *Deleted

## 2016-01-20 NOTE — Patient Outreach (Signed)
[facility]) Care Management  [place]  01/20/2016   [name] Feb 03, 1931 [id number]  Subjective: RN Health Coach telephone call to patient.  Hipaa compliance verified. Per patient he still gets tired easy when exercising. He has not built his endurance up. Per patient stated he is in the yellow zone. RN Health Coach asked patient what makes him in the yellow zone. Per patient he stated he gets short of breath when he does moderate amount of exercise. Per patient he is not short of breath at rest. Explained to patient that this would make him in the green zone. Patient stated his weight today is 160 pounds BP 124/69 Hr 74. Per patient his appetite has not picked up. He is able to eat anything spicy or steaks or any foods like. Per patient he has an appointment with a gastroenterologist net Thursday. Patient agrees to follow up outreach calls  Objective:   Encounter Medications:  Outpatient Encounter Prescriptions as of 01/20/2016  Medication Sig Note  . Alpha-D-Galactosidase (BEANO) TABS Take 1 tablet by mouth.   Arne Cleveland 5 MG TABS tablet Take 5 mg by mouth 2 (two) times daily. 04/14/2015: -  . furosemide (LASIX) 40 MG tablet Take 1 tablet (40 mg total) by mouth daily. 01/20/2016: Decreased to 20 mg QD  . lisinopril (PRINIVIL,ZESTRIL) 5 MG tablet Take by mouth. 05/17/2015: Received from: [place]  . metoprolol tartrate (LOPRESSOR) 25 MG tablet Take 1 tablet (25 mg total) by mouth 2 (two) times daily.   . Multiple Vitamin (MULTI-VITAMINS) TABS Take 1 tablet by mouth daily. 04/14/2015: -  . nitroGLYCERIN (NITROSTAT) 0.4 MG SL tablet Place 0.4 mg under the tongue every 5 (five) minutes x 3 doses as needed. For chest pain. If no relief call MD or go to emergency room. 04/14/2015: -  . vitamin E 400 UNIT capsule Take 400 Units by mouth daily. 06/18/2015: Patient reports taking 200 units daily   No facility-administered encounter medications on file as  of 01/20/2016.    Functional Status:  In your present state of health, do you have any difficulty performing the following activities: 01/20/2016 12/22/2015  Hearing? N N  Vision? N N  Difficulty concentrating or making decisions? N N  Walking or climbing stairs? Y Y  Dressing or bathing? N N  Doing errands, shopping? N N  Preparing Food and eating ? N -  Using the Toilet? N -  In the past six months, have you accidently leaked urine? N -  Do you have problems with loss of bowel control? N -  Managing your Medications? N -  Managing your Finances? N -  Housekeeping or managing your Housekeeping? N -    Fall/Depression Screening: PHQ 2/9 Scores 01/20/2016 12/22/2015 11/17/2015 10/21/2015 07/19/2015 06/18/2015 05/19/2015  PHQ - 2 Score _0 THN CM Care Plan Problem One        Most Recent Value   Care Plan Problem One  Knowledge deficit related to self management of congestive heart failure   Role Documenting the Problem One  [place] for Problem One  Active   THN Long Term Goal (31-90 days)  Patient will  maintain weight within the next 30 days   THN Long Term Goal Start Date  01/20/16 [not met. Patient has lost a pound since last outreach]   Interventions for Problem One Long Term Goal  Patient drinks additional nutritional supplements  to maintain body weight and is adding it to ice cream. RN health coach sent patient educational information on Heart healthy diet      THN CM Short Term Goal #1 (0-30 days)  Patient will to continue to state zones and action plan of congestive heart failure within the next 30 days   THN CM Short Term Goal #1 Start Date  01/20/16 [per patient he is in the yellow zone but with description he is in the green zone. Will need to reiterate the zones]   Interventions for Short Term Goal #1  RNCM sent  EMMI educational material on congestive heart failure. RNCM will continue to discuss signs and symptoms of CHF.  RNCM discussed Zones of congestive  heart failure   THN CM Short Term Goal #2 Start Date  01/20/16   THN CM Short Term Goal #3 (0-30 days)  Patient will continue to exercise on a regular basis to increase endurance   THN CM Short Term Goal #3 Start Date  01/20/16 [continue/ Patient is exercising but his endurance is not increasing]   Interventions for Short Tern Goal #3  Discussed with patient tips on increasing his exercise. Discussed with patient about talking with his cardiologist again. Talked with patient about getting into a cardiac exercise program to slowly build  up his strength.      Assessment:  Patient will benefit from Health Coach telephonic outreach for education and support for diabetes self management.  Plan:  RN sent additional information on Congestive Heart Failure RN send educational material on Peripheral Edema RN will send patient additional nutritional coupons RN will follow up within 30 days for discussion and teach back  [place] Management [phone]

## 2016-01-21 ENCOUNTER — Other Ambulatory Visit: Payer: Self-pay | Admitting: *Deleted

## 2016-01-21 NOTE — Patient Outreach (Signed)
[facility]) Care Management  01/21/2016  [name] 1930-10-07 [id number] Telephone call to RN Health Coach from  patient.  Hipaa compliance verified. Per patient he wanted RN Health Coach  to know that the gel like stools are starting again and is as bad as it was before. Per patient he has to wear paper in pants to keep it from going through. Patient is going to the gastroenterologist next week.  RN Health Coach made patient aware that he needs to tell the physician all that he has described. RN Health Coach explained to patient that he needs to keep himself clean with baby wipes or washing to prevent skin irritation. Also RN explained that he may want to wear a pad to keep gel-like substance from going through clothes.   Gean MaidensFrances Moss Berry BSN RN [facility] Care Management [phone]

## 2016-01-27 DIAGNOSIS — R1030 Lower abdominal pain, unspecified: Secondary | ICD-10-CM | POA: Diagnosis not present

## 2016-01-27 DIAGNOSIS — R935 Abnormal findings on diagnostic imaging of other abdominal regions, including retroperitoneum: Secondary | ICD-10-CM | POA: Diagnosis not present

## 2016-01-27 DIAGNOSIS — R197 Diarrhea, unspecified: Secondary | ICD-10-CM | POA: Diagnosis not present

## 2016-01-28 ENCOUNTER — Other Ambulatory Visit: Payer: Self-pay | Admitting: Gastroenterology

## 2016-01-28 DIAGNOSIS — R935 Abnormal findings on diagnostic imaging of other abdominal regions, including retroperitoneum: Secondary | ICD-10-CM

## 2016-01-28 DIAGNOSIS — R197 Diarrhea, unspecified: Secondary | ICD-10-CM | POA: Diagnosis not present

## 2016-02-17 ENCOUNTER — Ambulatory Visit: Payer: Self-pay | Admitting: *Deleted

## 2016-02-17 ENCOUNTER — Other Ambulatory Visit: Payer: Self-pay | Admitting: *Deleted

## 2016-02-18 ENCOUNTER — Ambulatory Visit
Admission: RE | Admit: 2016-02-18 | Discharge: 2016-02-18 | Disposition: A | Discharge status: Home / Self Care | Payer: PPO | Source: Ambulatory Visit | Attending: Gastroenterology | Admitting: Gastroenterology

## 2016-02-18 DIAGNOSIS — K8689 Other specified diseases of pancreas: Secondary | ICD-10-CM | POA: Diagnosis not present

## 2016-02-18 DIAGNOSIS — R935 Abnormal findings on diagnostic imaging of other abdominal regions, including retroperitoneum: Secondary | ICD-10-CM | POA: Insufficient documentation

## 2016-02-18 LAB — POCT I-STAT CREATININE: Creatinine, Ser: 1 mg/dL (ref 0.61–1.24)

## 2016-02-18 MED ORDER — GADOBENATE DIMEGLUMINE 529 MG/ML IV SOLN
15.0000 mL | Freq: Once | INTRAVENOUS | Status: AC | PRN
  Administered 2016-02-18: 15 mL via INTRAVENOUS

## 2016-03-02 ENCOUNTER — Encounter: Payer: Self-pay | Admitting: *Deleted

## 2016-03-02 NOTE — Patient Outreach (Signed)
[place]) Care Management  [place]  02/17/2016  [name] Feb 16, 1931 [id number]  Subjective:  RN Health Coach telephone call to patient.  Hipaa compliance verified. Per patient his weight is down. His weight today is 159 pounds.Per patient he is not having any swelling in his legs. He is only short of breath on exertion. He is in the green zone.  His appetite is not very good. He is drinking the supplemental nutrition to try to maintain some of his weight. Per patient it is hard to find foods that he can eat without his pancreas flaring up.  Per patient he feels like he is getting weaker. Patient stated he has no energy. Patient has made his physician aware and he is having a MRI of lower abdomen , pancreas , an gallbladder in the morning.  Patient requested that RN send him additional coupons for Glucerna and Ensure. Patient agreed to continue follow up outreach   Objective:   Encounter Medications:  Outpatient Encounter Prescriptions as of 02/17/2016  Medication Sig Note  . Alpha-D-Galactosidase (BEANO) TABS Take 1 tablet by mouth.   Arne Cleveland 5 MG TABS tablet Take 5 mg by mouth 2 (two) times daily. 04/14/2015: -  . furosemide (LASIX) 40 MG tablet Take 1 tablet (40 mg total) by mouth daily. 01/20/2016: Decreased to 20 mg QD  . lisinopril (PRINIVIL,ZESTRIL) 5 MG tablet Take by mouth. 05/17/2015: Received from: [place]  . metoprolol tartrate (LOPRESSOR) 25 MG tablet Take 1 tablet (25 mg total) by mouth 2 (two) times daily.   . Multiple Vitamin (MULTI-VITAMINS) TABS Take 1 tablet by mouth daily. 04/14/2015: -  . nitroGLYCERIN (NITROSTAT) 0.4 MG SL tablet Place 0.4 mg under the tongue every 5 (five) minutes x 3 doses as needed. For chest pain. If no relief call MD or go to emergency room. 04/14/2015: -  . vitamin E 400 UNIT capsule Take 400 Units by mouth daily. 06/18/2015: Patient reports taking 200 units daily   No facility-administered  encounter medications on file as of 02/17/2016.    Functional Status:  In your present state of health, do you have any difficulty performing the following activities: 02/17/2016 01/20/2016  Hearing? N N  Vision? N N  Difficulty concentrating or making decisions? N N  Walking or climbing stairs? Y Y  Dressing or bathing? N N  Doing errands, shopping? N N  Preparing Food and eating ? N N  Using the Toilet? N N  In the past six months, have you accidently leaked urine? - N  Do you have problems with loss of bowel control? N N  Managing your Medications? N N  Managing your Finances? N N  Housekeeping or managing your Housekeeping? - N    Fall/Depression Screening: PHQ 2/9 Scores 02/17/2016 01/20/2016 12/22/2015 11/17/2015 10/21/2015 07/19/2015 06/18/2015  PHQ - 2 Score 1 1 1 1 1 1 1    THN CM Care Plan Problem One        Most Recent Value   Care Plan Problem One  Knowledge deficit related to self management of congestive heart failure   Role Documenting the Problem One  [place] for Problem One  Active   THN Long Term Goal (31-90 days)  Patient will  maintain weight within the next 30 days   THN Long Term Goal Start Date  01/20/16 [not met. Patient continuing to loose weight]   Interventions for Problem One Long Term Goal  Patient drinks additional nutritional supplements to maintain body weight and is adding it to ice cream. RN health coach sent patient educational information on Heart healthy diet      THN CM Short Term Goal #1 (0-30 days)  Patient will to continue to state zones and action plan of congestive heart failure within the next 30 days   THN CM Short Term Goal #1 Start Date  02/17/16 [per patient he is in the yellow zone but with description he is in the green zone. Will need to reiterate the zones]   Interventions for Short Term Goal #1  RNCM sent  EMMI educational material on congestive heart failure. RNCM will continue to discuss signs and symptoms of CHF.  RNCM discussed  Zones of congestive heart failure   THN CM Short Term Goal #2 Start Date  01/20/16   THN CM Short Term Goal #3 (0-30 days)  Patient will continue to exercise on a regular basis to increase endurance   THN CM Short Term Goal #3 Start Date  02/17/16 [continue/ Patient is exercising but his endurance is not increasing]   Interventions for Short Tern Goal #3  Discussed with patient tips on increasing his exercise. Discussed with patient about talking with his cardiologist again. Talked with patient about getting into a cardiac exercise program to slowly build  up his strength.      Assessment:  Patient appetite is poor Patient will continue to benefit from Health Coach telephonic outreach for education and support for diabetes self management.  Plan:  RN sent patient educational information on low-fat diet for pancreatitis or gallbladder conditions RN sent nutritional supplemental coupons for glucerna and ensure as patient requested RN sent additional educational material on chronic pancreatitis RN will follow up within 30 days  [place] Management [phone]

## 2016-03-06 DIAGNOSIS — R194 Change in bowel habit: Secondary | ICD-10-CM | POA: Diagnosis not present

## 2016-03-06 DIAGNOSIS — R935 Abnormal findings on diagnostic imaging of other abdominal regions, including retroperitoneum: Secondary | ICD-10-CM | POA: Diagnosis not present

## 2016-03-16 DIAGNOSIS — I2510 Atherosclerotic heart disease of native coronary artery without angina pectoris: Secondary | ICD-10-CM | POA: Diagnosis not present

## 2016-03-16 DIAGNOSIS — R0602 Shortness of breath: Secondary | ICD-10-CM | POA: Diagnosis not present

## 2016-03-16 DIAGNOSIS — Z5189 Encounter for other specified aftercare: Secondary | ICD-10-CM | POA: Diagnosis not present

## 2016-03-16 DIAGNOSIS — I480 Paroxysmal atrial fibrillation: Secondary | ICD-10-CM | POA: Diagnosis not present

## 2016-03-17 ENCOUNTER — Encounter: Payer: Self-pay | Admitting: *Deleted

## 2016-03-17 ENCOUNTER — Other Ambulatory Visit: Payer: Self-pay | Admitting: *Deleted

## 2016-03-17 NOTE — Patient Outreach (Signed)
[facility]) Care Management  [place]  03/17/2016   [name] Aug 08, 1931 [id number]  Subjective: RN Health Coach telephone call to patient.  Hipaa compliance verified. Patient stated he is not having any shortness of breath. He does not have any swelling in his lower extremities. Per patient his  blood pressure had elevated and his heart rate elevated. The Dr had stopped the amiodarone and it had to be restarted. The Dr also increased his lasix. Per patient he feels so weak in his legs. Patient stated with the test that had been run so far nothing was found to describe why he is still  having the mucus in his stools. Patient states he is not doing so well. He stated he feels so tired all the time. Per patient his appetite has picked up some and he is still drinking the Ensure. Patient stated that he has some more tests scheduled. Patient has agreed to follow up outreach.   Objective:   Encounter Medications:  Outpatient Encounter Prescriptions as of 03/17/2016  Medication Sig Note  . Alpha-D-Galactosidase (BEANO) TABS Take 1 tablet by mouth.   Arne Cleveland 5 MG TABS tablet Take 5 mg by mouth 2 (two) times daily. 04/14/2015: -  . furosemide (LASIX) 40 MG tablet Take 1 tablet (40 mg total) by mouth daily. 01/20/2016: Decreased to 20 mg QD  . lisinopril (PRINIVIL,ZESTRIL) 5 MG tablet Take by mouth. 05/17/2015: Received from: [place]  . metoprolol tartrate (LOPRESSOR) 25 MG tablet Take 1 tablet (25 mg total) by mouth 2 (two) times daily.   . Multiple Vitamin (MULTI-VITAMINS) TABS Take 1 tablet by mouth daily. 04/14/2015: -  . nitroGLYCERIN (NITROSTAT) 0.4 MG SL tablet Place 0.4 mg under the tongue every 5 (five) minutes x 3 doses as needed. For chest pain. If no relief call MD or go to emergency room. 04/14/2015: -  . vitamin E 400 UNIT capsule Take 400 Units by mouth daily. 06/18/2015: Patient reports taking 200 units daily   No facility-administered  encounter medications on file as of 03/17/2016.    Functional Status:  In your present state of health, do you have any difficulty performing the following activities: 02/17/2016 01/20/2016  Hearing? N N  Vision? N N  Difficulty concentrating or making decisions? N N  Walking or climbing stairs? Y Y  Dressing or bathing? N N  Doing errands, shopping? N N  Preparing Food and eating ? N N  Using the Toilet? N N  In the past six months, have you accidently leaked urine? - N  Do you have problems with loss of bowel control? N N  Managing your Medications? N N  Managing your Finances? N N  Housekeeping or managing your Housekeeping? - N    Fall/Depression Screening: PHQ 2/9 Scores 03/17/2016 02/17/2016 01/20/2016 12/22/2015 11/17/2015 10/21/2015 07/19/2015  PHQ - 2 Score 1 1 1 1 1 1 1    THN CM Care Plan Problem One        Most Recent Value   Care Plan Problem One  Knowledge deficit related to self management of congestive heart failure   Role Documenting the Problem One  [place] for Problem One  Active   THN Long Term Goal (31-90 days)  Patient will  maintain weight within the next 30 days   Interventions for Problem One Long Term Goal  Patient drinks additional nutritional supplements to maintain body weight and is adding it to ice  cream. RN health coach sent patient educational information on Heart healthy diet      THN CM Short Term Goal #1 (0-30 days)  Patient will to continue to state zones and action plan of congestive heart failure within the next 30 days   THN CM Short Term Goal #1 Start Date  03/17/16   [facility] CM Short Term Goal #1 Met Date  03/17/16   Interventions for Short Term Goal #1  RNCM sent  EMMI educational material on congestive heart failure. RNCM will continue to discuss signs and symptoms of CHF.  RNCM discussed Zones of congestive heart failure   THN CM Short Term Goal #2 (0-30 days)  Patient will continue to weigh daily within the next 30 days and record   THN CM  Short Term Goal #2 Start Date  03/17/16   [facility] CM Short Term Goal #2 Met Date  03/17/16   Interventions for Short Term Goal #2  RNCM discussed the importance of daily weighing and recording. RNCM discussed any weight gain of 3 pounds or more in a day or 5 pounds in a week. RNCM discussed with patient when to notify the physician. Patient has a calendar book to record daily weights. RN will also be monitoring for excessive weight loss.   THN CM Short Term Goal #3 (0-30 days)  Patient will continue to exercise on a regular basis to increase endurance   THN CM Short Term Goal #3 Start Date  03/17/16   Interventions for Short Tern Goal #3  Discussed with patient tips on increasing his exercise. Discussed with patient about talking with his cardiologist again. Talked with patient about getting into a cardiac exercise program to slowly build  up his strength.       Assessment:  Patient will continue to benefit from Health Coach telephonic outreach for education and support for diabetes self management.  Plan:  RN sent patient additional nutritional discounted coupons.   RN sent patient educational material on hypertension RN sent patient educational information on low potassium RN sent educational information on fatigue RN will follow up within 30 days for discussion and teach back  [place] Management [phone]

## 2016-03-27 DIAGNOSIS — I10 Essential (primary) hypertension: Secondary | ICD-10-CM | POA: Diagnosis not present

## 2016-03-27 DIAGNOSIS — I480 Paroxysmal atrial fibrillation: Secondary | ICD-10-CM | POA: Diagnosis not present

## 2016-03-27 DIAGNOSIS — A047 Enterocolitis due to Clostridium difficile: Secondary | ICD-10-CM | POA: Diagnosis not present

## 2016-03-27 DIAGNOSIS — Z7901 Long term (current) use of anticoagulants: Secondary | ICD-10-CM | POA: Diagnosis not present

## 2016-03-27 DIAGNOSIS — E782 Mixed hyperlipidemia: Secondary | ICD-10-CM | POA: Diagnosis not present

## 2016-04-07 DIAGNOSIS — R0602 Shortness of breath: Secondary | ICD-10-CM | POA: Diagnosis not present

## 2016-04-07 DIAGNOSIS — I480 Paroxysmal atrial fibrillation: Secondary | ICD-10-CM | POA: Diagnosis not present

## 2016-04-10 ENCOUNTER — Encounter: Payer: Self-pay | Admitting: *Deleted

## 2016-04-10 ENCOUNTER — Other Ambulatory Visit: Payer: Self-pay | Admitting: *Deleted

## 2016-04-10 NOTE — Patient Outreach (Signed)
[place] [facility]) Care Management  04/10/2016   [name] [birth year] [id number]  Subjective: RN Health Coach telephone call to patient.  Hipaa compliance verified. Per patient he has not been to the gym in a few days. Patient stated he feels weak. Per patient he has appointment with his Dr tomorrow. Patient feels that some of the way he is feeling  is contributed to the increase in the amiodarone medication he is taking. Per patient he will talk about the dosage with the Dr.  Patient is maintaining his weight. He is drinking the nutritional supplements.Patient is in the green zone. He only has a little shortness of breath with moderate exercise. He is not having any peripheral edema.  Per patient he is afraid to eat certain foods because  the pancreatitis might flare up. Patient is eating mostly baked foods. Patient is interested in getting information on gluten free diet. Patient requested additional glucerna coupons. Patient has agreed to follow up outreach calls   Objective:   Current Medications:  Current Outpatient Prescriptions  Medication Sig Dispense Refill  . Alpha-D-Galactosidase (BEANO) TABS Take 1 tablet by mouth.    Marland Kitchen amiodarone (PACERONE) 400 MG tablet Take 400 mg by mouth daily.    Marland Kitchen ELIQUIS 5 MG TABS tablet Take 5 mg by mouth 2 (two) times daily.  11  . furosemide (LASIX) 40 MG tablet Take 1 tablet (40 mg total) by mouth daily. 30 tablet 5  . metoprolol tartrate (LOPRESSOR) 25 MG tablet Take 1 tablet (25 mg total) by mouth 2 (two) times daily.    . Multiple Vitamin (MULTI-VITAMINS) TABS Take 1 tablet by mouth daily.    . nitroGLYCERIN (NITROSTAT) 0.4 MG SL tablet Place 0.4 mg under the tongue every 5 (five) minutes x 3 doses as needed. For chest pain. If no relief call MD or go to emergency room.    . vitamin E 400 UNIT capsule Take 400 Units by mouth daily.    Marland Kitchen lisinopril (PRINIVIL,ZESTRIL) 5 MG tablet Take by mouth.     No current facility-administered  medications for this visit.     Functional Status:  In your present state of health, do you have any difficulty performing the following activities: 04/10/2016 02/17/2016  Hearing? N N  Vision? N N  Difficulty concentrating or making decisions? N N  Walking or climbing stairs? Y Y  Dressing or bathing? N N  Doing errands, shopping? N N  Preparing Food and eating ? N N  Using the Toilet? N N  In the past six months, have you accidently leaked urine? N -  Do you have problems with loss of bowel control? N N  Managing your Medications? N N  Managing your Finances? N N  Housekeeping or managing your Housekeeping? N -  Some recent data might be hidden    Fall/Depression Screening: PHQ 2/9 Scores 04/10/2016 03/17/2016 02/17/2016 01/20/2016 12/22/2015 11/17/2015 10/21/2015  PHQ - 2 Score _0 THN CM Care Plan Problem One   Flowsheet Row Most Recent Value  Care Plan Problem One  Knowledge deficit related to self management of congestive heart failure  Role Documenting the Problem One  [place] for Problem One  Active  THN Long Term Goal (31-90 days)  Patient will  maintain weight within the next 30 days  THN Long Term Goal Met Date  04/10/16  Interventions for Problem One Long Term Goal  Patient drinks additional  nutritional supplements to maintain body weight and is adding it to ice cream. RN health coach sent patient educational information on Heart healthy diet     THN CM Short Term Goal #3 (0-30 days)  Patient will continue to exercise on a regular basis to increase endurance  THN CM Short Term Goal #3 Start Date  04/10/16  Interventions for Short Tern Goal #3  Discussed with patient tips on increasing his exercise. Discussed with patient about talking with his cardiologist again. Talked with patient about getting into a cardiac exercise program to slowly build  up his strength.      Assessment:  Patient will continue to  benefit from Health Coach telephonic outreach  for education and support for diabetes self management.  Plan:  RN will send patient nutritional glucerna coupons RN will send patient educational material on gluten free diet RN will send patient additional educational material on Atrial Fibrillation RN will follow up outreach within the month of August   [name] [place] Management [phone]

## 2016-04-11 DIAGNOSIS — I480 Paroxysmal atrial fibrillation: Secondary | ICD-10-CM | POA: Diagnosis not present

## 2016-04-11 DIAGNOSIS — E782 Mixed hyperlipidemia: Secondary | ICD-10-CM | POA: Diagnosis not present

## 2016-04-11 DIAGNOSIS — I2510 Atherosclerotic heart disease of native coronary artery without angina pectoris: Secondary | ICD-10-CM | POA: Diagnosis not present

## 2016-04-11 DIAGNOSIS — I5022 Chronic systolic (congestive) heart failure: Secondary | ICD-10-CM | POA: Diagnosis not present

## 2016-04-11 DIAGNOSIS — R0602 Shortness of breath: Secondary | ICD-10-CM | POA: Diagnosis not present

## 2016-05-09 DIAGNOSIS — R0602 Shortness of breath: Secondary | ICD-10-CM | POA: Diagnosis not present

## 2016-05-09 DIAGNOSIS — E782 Mixed hyperlipidemia: Secondary | ICD-10-CM | POA: Diagnosis not present

## 2016-05-09 DIAGNOSIS — I2510 Atherosclerotic heart disease of native coronary artery without angina pectoris: Secondary | ICD-10-CM | POA: Diagnosis not present

## 2016-05-12 ENCOUNTER — Other Ambulatory Visit: Payer: Self-pay | Admitting: *Deleted

## 2016-05-12 ENCOUNTER — Encounter: Payer: Self-pay | Admitting: *Deleted

## 2016-05-12 NOTE — Patient Outreach (Addendum)
[facility]) Care Management  05/12/2016   Joseph BunCarl L Wise 23-May-1931 [id number]  Subjective: RN Health Coach telephone call to patient.  Hipaa compliance verified. Patient is holding his weight at 161 pounds.Patient does not have any swelling in his lower extremities. Patient doesn't  have any cough.  Patient is eating a bland diet. He states he is still having a lot of mucus in his stool. Patient is drinking ensure supplement  to keep his weight stable.Patient is in the green zone. Patient has agreed to follow up outreach calls.    Objective:   Current Medications:  Current Outpatient Prescriptions  Medication Sig Dispense Refill  . Alpha-D-Galactosidase (BEANO) TABS Take 1 tablet by mouth.    Everlene Balls. ELIQUIS 5 MG TABS tablet Take 5 mg by mouth 2 (two) times daily.  11  . furosemide (LASIX) 40 MG tablet Take 1 tablet (40 mg total) by mouth daily. 30 tablet 5  . metoprolol tartrate (LOPRESSOR) 25 MG tablet Take 1 tablet (25 mg total) by mouth 2 (two) times daily.    . Multiple Vitamin (MULTI-VITAMINS) TABS Take 1 tablet by mouth daily.    . nitroGLYCERIN (NITROSTAT) 0.4 MG SL tablet Place 0.4 mg under the tongue every 5 (five) minutes x 3 doses as needed. For chest pain. If no relief call MD or go to emergency room.    . vitamin E 400 UNIT capsule Take 400 Units by mouth daily.    Marland Kitchen. amiodarone (PACERONE) 400 MG tablet Take 400 mg by mouth daily.    Marland Kitchen. lisinopril (PRINIVIL,ZESTRIL) 5 MG tablet Take by mouth.     No current facility-administered medications for this visit.     Functional Status:  In your present state of health, do you have any difficulty performing the following activities: 05/12/2016 04/10/2016  Hearing? N N  Vision? N N  Difficulty concentrating or making decisions? N N  Walking or climbing stairs? Y Y  Dressing or bathing? N N  Doing errands, shopping? N N  Preparing Food and eating ? N N  Using the Toilet? N N  In the past six months, have you  accidently leaked urine? N N  Do you have problems with loss of bowel control? N N  Managing your Medications? N N  Managing your Finances? N N  Housekeeping or managing your Housekeeping? N N  Some recent data might be hidden    Fall/Depression Screening: PHQ 2/9 Scores 05/12/2016 04/10/2016 03/17/2016 02/17/2016 01/20/2016 12/22/2015 11/17/2015  PHQ - 2 Score 0 1 1 1 1 1 1     Assessment: Patient will continue to benefit from Health Coach telephonic outreach for education and support for Congestive Heart Failure self management.   Plan:  RN will send patient educational material on bland diet RN will send patient additional nutritional coupons on ensure RN will follow up outreach within the month of September   Gean MaidensFrances Chaselynn Kepple BSN RN [facility] Care Management [phone]

## 2016-06-12 ENCOUNTER — Other Ambulatory Visit: Payer: Self-pay | Admitting: *Deleted

## 2016-06-12 ENCOUNTER — Encounter: Payer: Self-pay | Admitting: *Deleted

## 2016-06-12 NOTE — Patient Outreach (Addendum)
[facility]) Care Management  06/12/2016   Maryruth BunCarl L Sedgwick [birth year] [id number]  Subjective:RN Health Coach telephone call to patient.  Hipaa compliance verified.Per patient he is doing good. His weight today is 163 pounds. Per patient he went to the gym today and did 20 chin ups, 20 leg lifts. He exercised on the stair climber doing five flights of stairs. Patient stated he gets real tired.  Patient does drink supplemental nutrition to help maintain his weight.  Patient is in the green zone of CHF. Per patient he has no lower extremity edema or coughing. Patient asked what his blood pressure is suppose  to be.   RN discussed range. Patient has agreed to follow up outreach call.    Objective:   Current Medications:  Current Outpatient Prescriptions  Medication Sig Dispense Refill  . Alpha-D-Galactosidase (BEANO) TABS Take 1 tablet by mouth.    Marland Kitchen. amiodarone (PACERONE) 400 MG tablet Take 400 mg by mouth daily.    Marland Kitchen. ELIQUIS 5 MG TABS tablet Take 5 mg by mouth 2 (two) times daily.  11  . furosemide (LASIX) 40 MG tablet Take 1 tablet (40 mg total) by mouth daily. 30 tablet 5  . lisinopril (PRINIVIL,ZESTRIL) 5 MG tablet Take by mouth.    . metoprolol tartrate (LOPRESSOR) 25 MG tablet Take 1 tablet (25 mg total) by mouth 2 (two) times daily.    . Multiple Vitamin (MULTI-VITAMINS) TABS Take 1 tablet by mouth daily.    . nitroGLYCERIN (NITROSTAT) 0.4 MG SL tablet Place 0.4 mg under the tongue every 5 (five) minutes x 3 doses as needed. For chest pain. If no relief call MD or go to emergency room.    . vitamin E 400 UNIT capsule Take 400 Units by mouth daily.     No current facility-administered medications for this visit.     Functional Status:  In your present state of health, do you have any difficulty performing the following activities: 06/12/2016 05/12/2016  Hearing? N N  Vision? N N  Difficulty concentrating or making decisions? N N  Walking or climbing stairs? Y Y   Dressing or bathing? N N  Doing errands, shopping? N N  Preparing Food and eating ? N N  Using the Toilet? N N  In the past six months, have you accidently leaked urine? N N  Do you have problems with loss of bowel control? N N  Managing your Medications? N N  Managing your Finances? N N  Housekeeping or managing your Housekeeping? N N  Some recent data might be hidden    Fall/Depression Screening: PHQ 2/9 Scores 06/12/2016 05/12/2016 04/10/2016 03/17/2016 02/17/2016 01/20/2016 12/22/2015  PHQ - 2 Score 0 0 1 1 1 1 1    [facility] CM Care Plan Problem One   Flowsheet Row Most Recent Value  Care Plan Problem One  Knowledge deficit related to self management of congestive heart failure  Role Documenting the Problem One  Health Coach  Care Plan for Problem One  Active  [facility] Long Term Goal (31-90 days)  Patient will  maintain weight within the next 30 days  [facility] CM Short Term Goal #3 (0-30 days)  Patient will continue to exercise on a regular basis to increase endurance  [facility] CM Short Term Goal #3 Start Date  06/12/16  Interventions for Short Tern Goal #3  Discussed with patient tips on increasing his exercise. Discussed with patient about talking with his cardiologist again. Talked with patient about getting into a  cardiac exercise program to slowly build  up his strength.  [facility] CM Short Term Goal #4 (0-30 days)  Patient will be able to verbalize what his blood pressure readings mean within the next 30 days  [facility] CM Short Term Goal #4 Start Date  06/12/16  Interventions for Short Term Goal #4  RN sent patient educational material on what the blood pressure reading mean and what range his should be in. RN will follow up with further discussion and teachback       Assessment:  Patient does not understand blood pressure readings Patient will continue to benefit from Health Coach telephonic outreach for education and support for Congestive Heart Failure self management. Plan:  RN sent educational material on  reading blood pressures RN sent 2017-2018 calendar nook RN sent nutritional coupons to supplement cost for patient  [name] BSN RN [facility] [phone]

## 2016-06-13 DIAGNOSIS — Z1329 Encounter for screening for other suspected endocrine disorder: Secondary | ICD-10-CM | POA: Diagnosis not present

## 2016-06-13 DIAGNOSIS — Z79899 Other long term (current) drug therapy: Secondary | ICD-10-CM | POA: Diagnosis not present

## 2016-06-13 DIAGNOSIS — I480 Paroxysmal atrial fibrillation: Secondary | ICD-10-CM | POA: Diagnosis not present

## 2016-06-13 DIAGNOSIS — Z9109 Other allergy status, other than to drugs and biological substances: Secondary | ICD-10-CM | POA: Diagnosis not present

## 2016-06-13 DIAGNOSIS — E782 Mixed hyperlipidemia: Secondary | ICD-10-CM | POA: Diagnosis not present

## 2016-06-13 DIAGNOSIS — K8689 Other specified diseases of pancreas: Secondary | ICD-10-CM | POA: Diagnosis not present

## 2016-06-13 DIAGNOSIS — R0602 Shortness of breath: Secondary | ICD-10-CM | POA: Diagnosis not present

## 2016-06-13 DIAGNOSIS — K861 Other chronic pancreatitis: Secondary | ICD-10-CM | POA: Diagnosis not present

## 2016-06-13 DIAGNOSIS — I2510 Atherosclerotic heart disease of native coronary artery without angina pectoris: Secondary | ICD-10-CM | POA: Diagnosis not present

## 2016-06-13 DIAGNOSIS — I10 Essential (primary) hypertension: Secondary | ICD-10-CM | POA: Diagnosis not present

## 2016-06-13 DIAGNOSIS — Z125 Encounter for screening for malignant neoplasm of prostate: Secondary | ICD-10-CM | POA: Diagnosis not present

## 2016-06-14 DIAGNOSIS — R0602 Shortness of breath: Secondary | ICD-10-CM | POA: Diagnosis not present

## 2016-07-10 ENCOUNTER — Other Ambulatory Visit: Payer: Self-pay | Admitting: *Deleted

## 2016-07-10 ENCOUNTER — Encounter: Payer: Self-pay | Admitting: *Deleted

## 2016-07-10 DIAGNOSIS — Z125 Encounter for screening for malignant neoplasm of prostate: Secondary | ICD-10-CM | POA: Diagnosis not present

## 2016-07-10 DIAGNOSIS — I472 Ventricular tachycardia: Secondary | ICD-10-CM | POA: Diagnosis not present

## 2016-07-10 DIAGNOSIS — E782 Mixed hyperlipidemia: Secondary | ICD-10-CM | POA: Diagnosis not present

## 2016-07-10 DIAGNOSIS — Z79899 Other long term (current) drug therapy: Secondary | ICD-10-CM | POA: Diagnosis not present

## 2016-07-10 DIAGNOSIS — I10 Essential (primary) hypertension: Secondary | ICD-10-CM | POA: Diagnosis not present

## 2016-07-10 DIAGNOSIS — Z1329 Encounter for screening for other suspected endocrine disorder: Secondary | ICD-10-CM | POA: Diagnosis not present

## 2016-07-10 DIAGNOSIS — I2510 Atherosclerotic heart disease of native coronary artery without angina pectoris: Secondary | ICD-10-CM | POA: Diagnosis not present

## 2016-07-10 DIAGNOSIS — I5022 Chronic systolic (congestive) heart failure: Secondary | ICD-10-CM | POA: Diagnosis not present

## 2016-07-10 DIAGNOSIS — I480 Paroxysmal atrial fibrillation: Secondary | ICD-10-CM | POA: Diagnosis not present

## 2016-07-10 NOTE — Patient Outreach (Signed)
[facility]) Care Management  07/10/2016  Joseph BunCarl L Wise Aug 30, 1931 [id number]   RN Health Coach attempted #1 Follow up outreach call to patient.  Patient was unavailable. HIPPA compliance message was left with Wife return callback number.  Plan: RN will call patient again within 14 days.    Joseph MaidensFrances Drayk Wise BSN RN [facility] [phone]

## 2016-07-10 NOTE — Patient Outreach (Signed)
[facility]) Care Management  07/10/2016   [name] 1931/06/02 [id number]  Subjective: [name]  received return  outreach call from  patient.  Per patient he just came back from PCP and Cardiologist. Per patient he is in the green zone. No shortness of breath only with exertional exercise. No edema in lower extremities. Patient is drinking nutrityional supplements to maintain weight. Patient had questions regarding should he get the shingles shot. Patient agreed to follow up outreach calls.   Objective:   Current Medications:  Current Outpatient Prescriptions  Medication Sig Dispense Refill  . Alpha-D-Galactosidase (BEANO) TABS Take 1 tablet by mouth.    Arne Cleveland 5 MG TABS tablet Take 5 mg by mouth 2 (two) times daily.  11  . furosemide (LASIX) 40 MG tablet Take 1 tablet (40 mg total) by mouth daily. 30 tablet 5  . metoprolol tartrate (LOPRESSOR) 25 MG tablet Take 1 tablet (25 mg total) by mouth 2 (two) times daily.    . Multiple Vitamin (MULTI-VITAMINS) TABS Take 1 tablet by mouth daily.    . nitroGLYCERIN (NITROSTAT) 0.4 MG SL tablet Place 0.4 mg under the tongue every 5 (five) minutes x 3 doses as needed. For chest pain. If no relief call MD or go to emergency room.    . vitamin E 400 UNIT capsule Take 400 Units by mouth daily.    Marland Kitchen amiodarone (PACERONE) 400 MG tablet Take 400 mg by mouth daily.    Marland Kitchen lisinopril (PRINIVIL,ZESTRIL) 5 MG tablet Take by mouth.     No current facility-administered medications for this visit.     Functional Status:  In your present state of health, do you have any difficulty performing the following activities: 07/10/2016 06/12/2016  Hearing? N N  Vision? N N  Difficulty concentrating or making decisions? N N  Walking or climbing stairs? Y Y  Dressing or bathing? N N  Doing errands, shopping? N N  Preparing Food and eating ? N N  Using the Toilet? N N  In the past six months, have you accidently leaked urine? N N   Do you have problems with loss of bowel control? - N  Managing your Medications? N N  Managing your Finances? N N  Housekeeping or managing your Housekeeping? N N  Some recent data might be hidden    Fall/Depression Screening: PHQ 2/9 Scores 07/10/2016 06/12/2016 05/12/2016 04/10/2016 03/17/2016 02/17/2016 01/20/2016  PHQ - 2 Score 0 0 0 _0 [facility] CM Care Plan Problem One   Flowsheet Row Most Recent Value  Care Plan Problem One  Knowledge deficit related to self management of congestive heart failure  Role Documenting the Problem One  [place] for Problem One  Active  [facility] CM Short Term Goal #3 (0-30 days)  Patient will continue to exercise on a regular basis to increase endurance  Interventions for Short Tern Goal #3  Discussed with patient tips on increasing his exercise. Discussed with patient about talking with his cardiologist again. Talked with patient about getting into a cardiac exercise program to slowly build  up his strength.  [facility] CM Short Term Goal #4 Met Date  07/10/16     Assessment:  Patient has question regarding shingles and vaccines Patient was able to correlate today blood pressure from Dr office with information he learned Patient has not had an eye exam in over a year Patient has received Flu immunization  Plan:  Patient  will verbalize that he has made an eye appointment before next follow up call RN sent patient educational material on shingles RN sent ensure coupons for nutritional needs RN will follow up within the month of November  Sharron Simpson [place] [facility] [phone]

## 2016-07-18 DIAGNOSIS — Z9109 Other allergy status, other than to drugs and biological substances: Secondary | ICD-10-CM | POA: Diagnosis not present

## 2016-07-18 DIAGNOSIS — R159 Full incontinence of feces: Secondary | ICD-10-CM | POA: Diagnosis not present

## 2016-08-07 ENCOUNTER — Encounter: Payer: Self-pay | Admitting: *Deleted

## 2016-08-07 ENCOUNTER — Other Ambulatory Visit: Payer: Self-pay | Admitting: *Deleted

## 2016-08-07 NOTE — Patient Outreach (Signed)
[facility]) Care Management  08/07/2016   Joseph BunCarl L Wise [birth year] [id number]  Subjective: RN Health Coach telephone call to patient.  Hipaa compliance verified. Per patient he is in the green zone. Per patient he has been checking his blood pressure daily and when he doesn't feel good. Per patient when the top number gets above 140 he gets a little dizzy and unsteady. Per patient he has had any falls . Patient stated his appetite is doing pretty good. He is eating and maintaining his weight at 163 pounds. Patient stated he still feels a little weak  With moderate exertion but he is feeling better. Patient has agreed to follow up outreach calls.   Objective:   Current Medications:  Current Outpatient Prescriptions  Medication Sig Dispense Refill  . Alpha-D-Galactosidase (BEANO) TABS Take 1 tablet by mouth.    Everlene Balls. ELIQUIS 5 MG TABS tablet Take 5 mg by mouth 2 (two) times daily.  11  . furosemide (LASIX) 40 MG tablet Take 1 tablet (40 mg total) by mouth daily. 30 tablet 5  . imipramine (TOFRANIL) 25 MG tablet Take 25 mg by mouth daily.    Marland Kitchen. levocetirizine (XYZAL) 5 MG tablet Take 5 mg by mouth every evening.    . metoprolol tartrate (LOPRESSOR) 25 MG tablet Take 1 tablet (25 mg total) by mouth 2 (two) times daily.    . Multiple Vitamin (MULTI-VITAMINS) TABS Take 1 tablet by mouth daily.    . nitroGLYCERIN (NITROSTAT) 0.4 MG SL tablet Place 0.4 mg under the tongue every 5 (five) minutes x 3 doses as needed. For chest pain. If no relief call MD or go to emergency room.    . vitamin E 400 UNIT capsule Take 400 Units by mouth daily.    Marland Kitchen. amiodarone (PACERONE) 400 MG tablet Take 400 mg by mouth daily.    Marland Kitchen. lisinopril (PRINIVIL,ZESTRIL) 5 MG tablet Take by mouth.     No current facility-administered medications for this visit.     Functional Status:  In your present state of health, do you have any difficulty performing the following activities: 08/07/2016 07/10/2016   Hearing? N N  Vision? N N  Difficulty concentrating or making decisions? N N  Walking or climbing stairs? Y Y  Dressing or bathing? N N  Doing errands, shopping? N N  Preparing Food and eating ? - N  Using the Toilet? - N  In the past six months, have you accidently leaked urine? - N  Do you have problems with loss of bowel control? - -  Managing your Medications? - N  Managing your Finances? - N  Housekeeping or managing your Housekeeping? - N  Some recent data might be hidden    Fall/Depression Screening: PHQ 2/9 Scores 08/07/2016 07/10/2016 06/12/2016 05/12/2016 04/10/2016 03/17/2016 02/17/2016  PHQ - 2 Score 0 0 0 0 1 1 1     Assessment:  Patient is maintaining weight at 163 pounds Patient is having some elevation in systolic blood pressure Patient is in green zone Patient will continue to benefit from Health Coach telephonic outreach for education and support for congestive heart failure self management.  Plan:  RN sent patient educational material on managing hypertension RN discussed with patient what systolic and diastolic pressures mean Patient is keeping a record and will make Dr aware RN will follow up discussion in December after patient Dr visit  Gean MaidensFrances Amber Guthridge BSN RN [facility] [phone]

## 2016-08-19 IMAGING — MR MR ABDOMEN WO/W CM MRCP
14 of 22 series · 26 of 48 positions shown · IV contrast (15 mL MULTIHANCE)
Comparison: CT 12/19/2015

CLINICAL DATA: Abdominal pain for 6 months. Dilated pancreatic duct
on prior CT.

EXAM:
MRI ABDOMEN WITHOUT AND WITH CONTRAST (INCLUDING MRCP)
TECHNIQUE: Multiplanar multisequence MR imaging of the abdomen was performed
both before and after the administration of intravenous contrast.
Heavily T2-weighted images of the biliary and pancreatic ducts were
obtained, and three-dimensional MRCP images were rendered by post
processing.
CONTRAST:  15mL MULTIHANCE GADOBENATE DIMEGLUMINE 529 MG/ML IV SOLN

[Series 4: T2 fat-sat · axial · 7.0mm · 0.74mm/px · 1 of 25 slices shown]
[im 1/25]
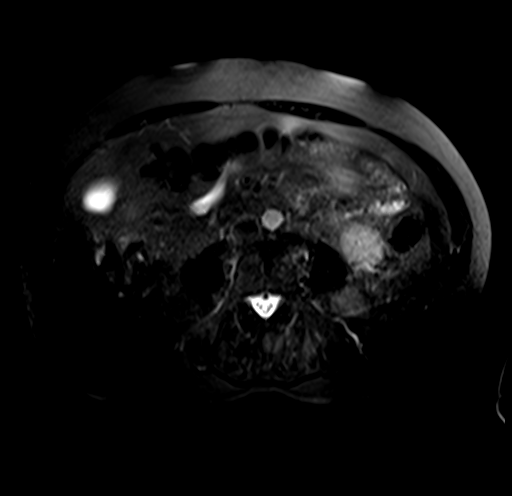

[Series 8: T2 · axial · 7.0mm · 0.74mm/px · 1 of 27 slices shown (1 of 2)]
[im 1/27]
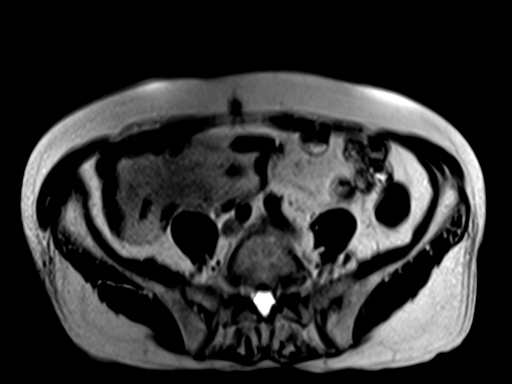

[Series 9: cor thins · coronal · 4.0mm · 0.89mm/px · 1 of 15 slices shown (1 of 2)]
[im 1/15]
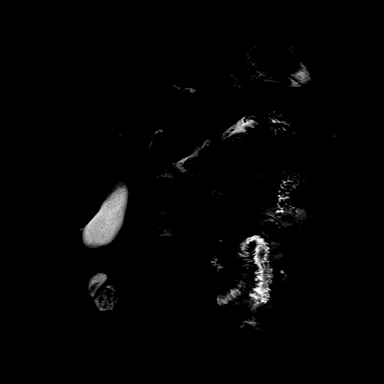

[Series 10: cor thins · coronal · 4.0mm · 0.89mm/px · 1 of 15 slices shown (2 of 2)]
[im 1/15]
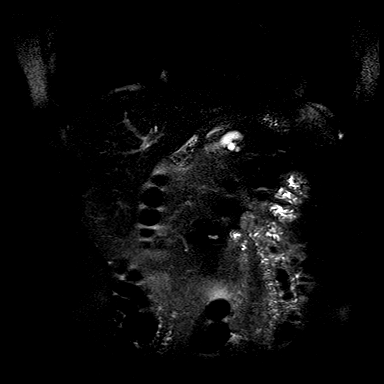

[Series 11: DWI · axial · 6.0mm · 1.82mm/px · z∈[-114,+109]mm · 3 of 96 slices shown]
[im 1/96]
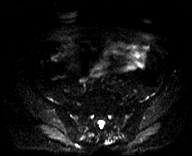
[im 48/96]
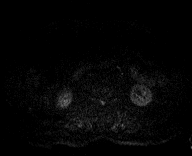
[im 96/96]
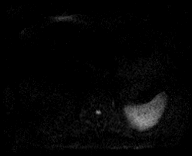

[Series 12: ax dwi_adc · axial · 6.0mm · 1.82mm/px · 1 of 32 slices shown]
[im 1/32]
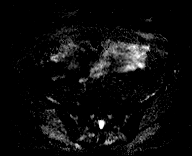

[Series 13: T2 · coronal · 8.0mm · 0.78mm/px · 1 of 19 slices shown (2 of 2)]
[im 1/19]
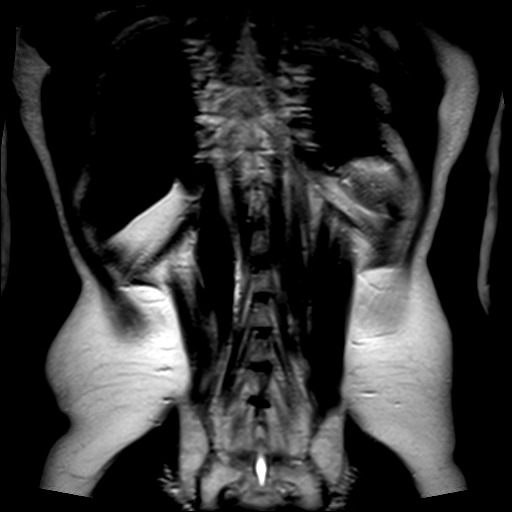

[Series 14: ax dual echo · axial · 6.0mm · 0.74mm/px · z∈[-115,+108]mm · 2 of 64 slices shown]
[im 1/64]
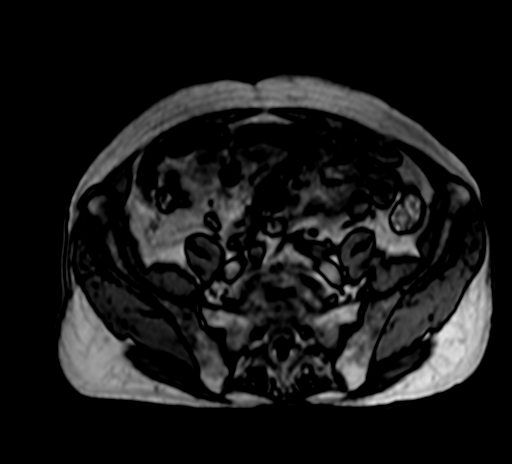
[im 64/64]
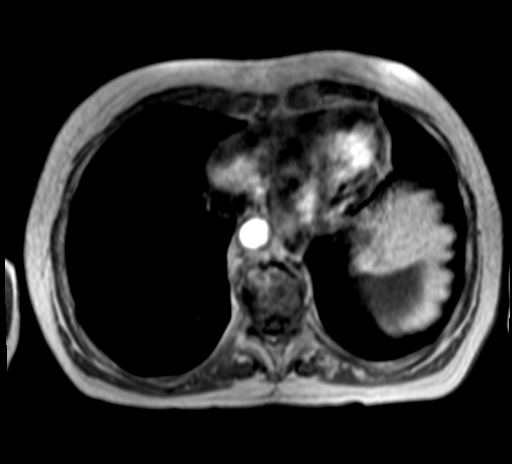

[Series 16: cor tru fisp · coronal · 4.0mm · 0.74mm/px · 1 of 40 slices shown]
[im 1/40]
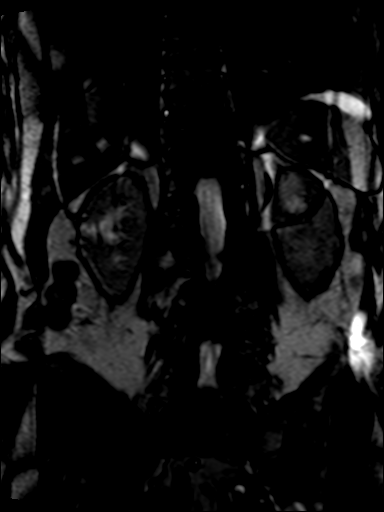

[Series 17: T1 dynamic fat-sat · axial · non-contrast · 2.5mm · 0.74mm/px · z∈[-62,+136]mm · 3 of 80 slices shown (1 of 2)]
[im 1/80]
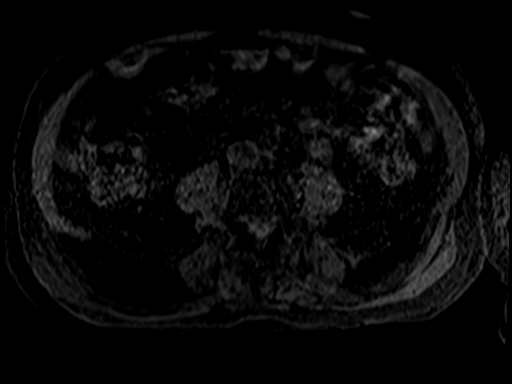
[im 40/80]
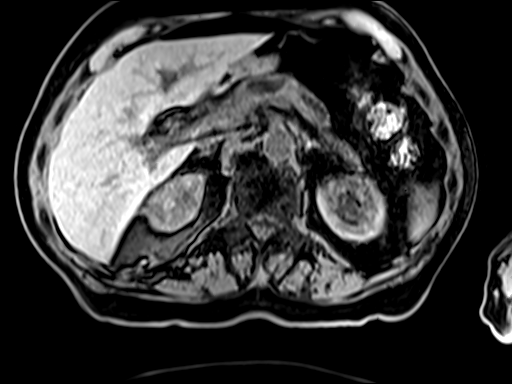
[im 80/80]
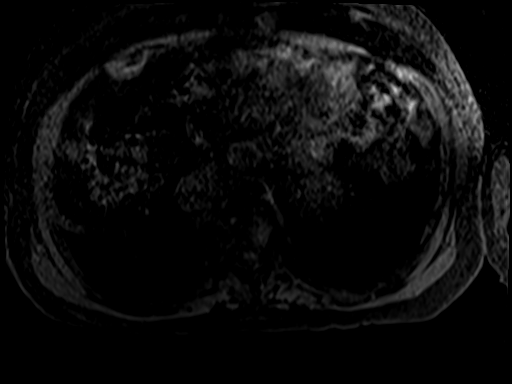

[Series 20: T1 dynamic fat-sat · axial · 2.5mm · 0.74mm/px · z∈[-62,+136]mm · 3 of 80 slices shown (2 of 2)]
[im 1/80]
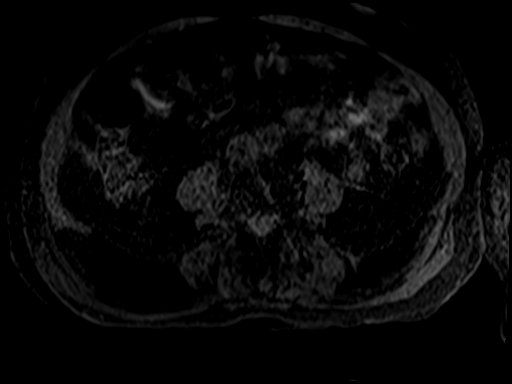
[im 40/80]
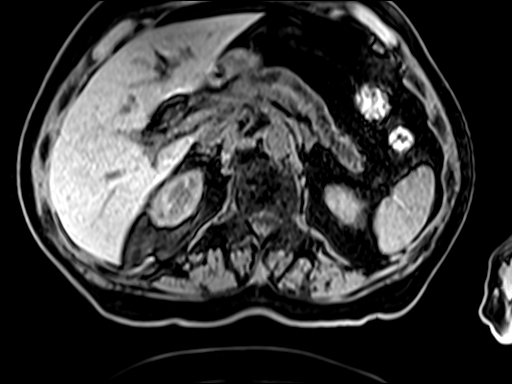
[im 80/80]
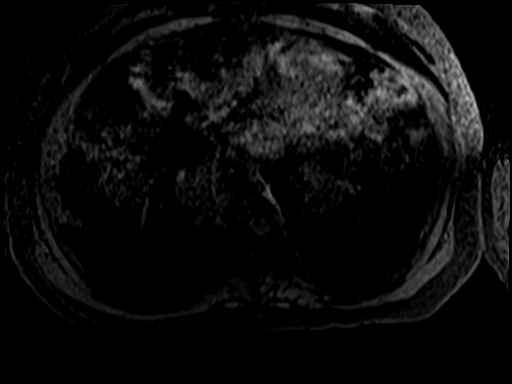

[Series 21: T1 dynamic fat-sat post-contrast · axial · 2.5mm · 0.74mm/px · z∈[-62,+136]mm · 3 of 80 slices shown (1 of 3)]
[im 1/80]
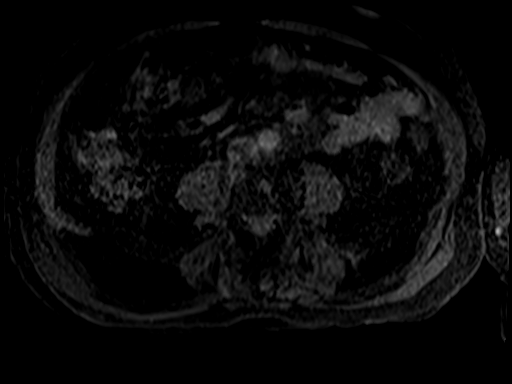
[im 40/80]
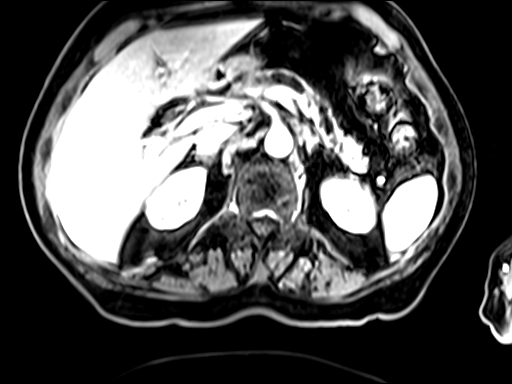
[im 80/80]
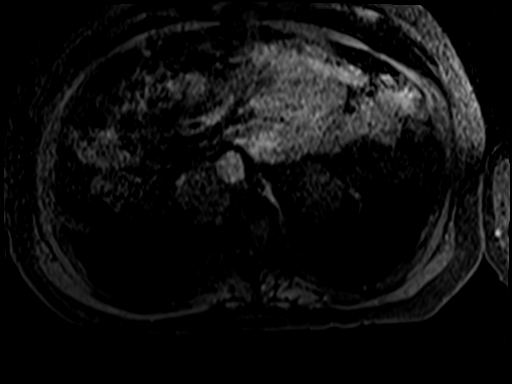

[Series 22: T1 dynamic fat-sat post-contrast · axial · 2.5mm · 0.74mm/px · z∈[-62,+136]mm · 3 of 80 slices shown (2 of 3)]
[im 1/80]
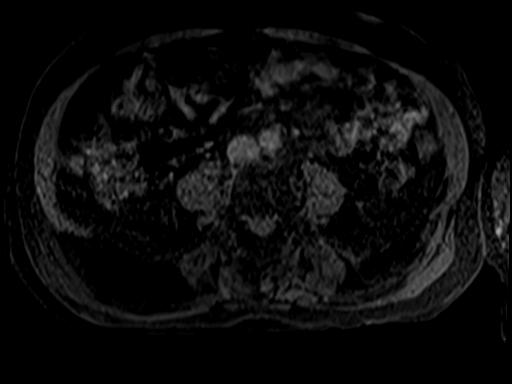
[im 40/80]
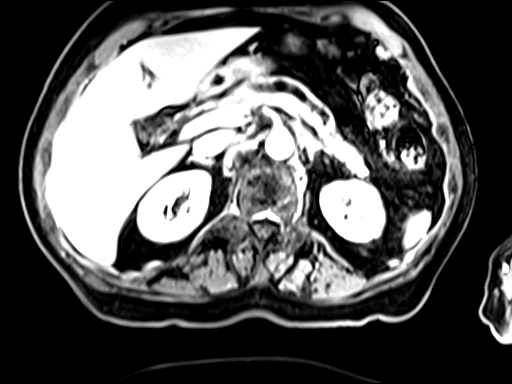
[im 80/80]
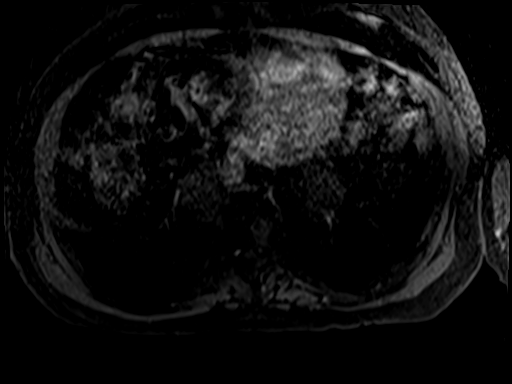

[Series 23: T1 dynamic fat-sat post-contrast · coronal · 2.5mm · 0.82mm/px · 2 of 96 slices shown (3 of 3)]
[im 1/96]
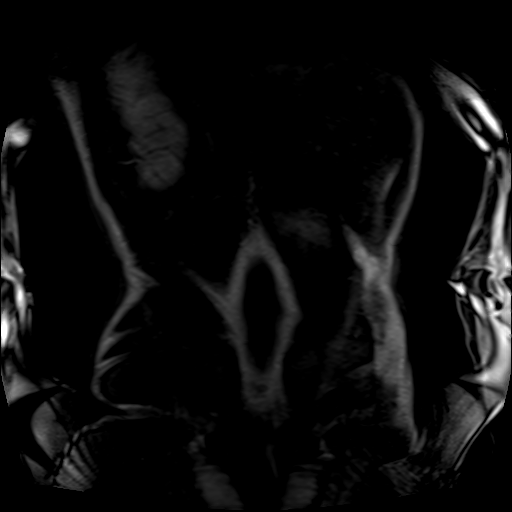
[im 48/96]
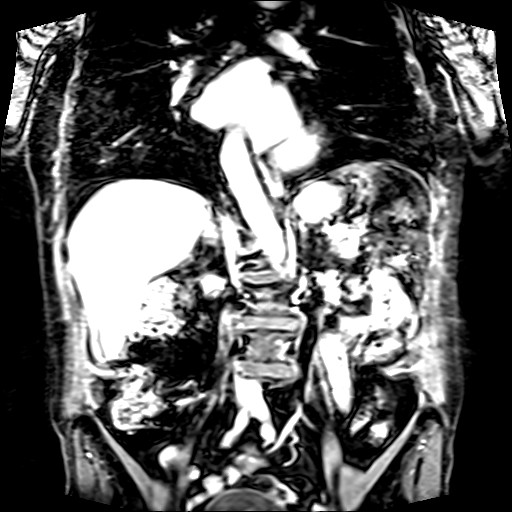

[26 of 48 positions shown; findings below may reference images not displayed]

FINDINGS: Mild motion degradation throughout.

Lower chest: Mild cardiomegaly, without pericardial or pleural
effusion.

Hepatobiliary: Normal liver, without intrahepatic biliary duct
dilatation. Normal gallbladder, without biliary ductal dilatation.

Pancreas: Side branch duct ectasia is identified within the
pancreatic tail on image 7/ series 8 and within the pancreatic neck
on image 10/series 8. The main pancreatic duct is moderately
dilated, including at 12 mm on image 15/series 4. Duct dilatation is
followed to the level of the pancreatic head. although no
well-defined mass is seen, there is vague hypo enhancement in this
area which measures 2.0 x 1.9 cm on image 58/series 21. The expected
T1 hyperintensity throughout the pancreas is absent, especially in
the region of the the pancreatic head. Example image 51/series 20.
No evidence of acute pancreatitis. No definite communication between
the dilated pancreatic duct and common duct.

Spleen: Normal in size, without focal abnormality.

Adrenals/Urinary Tract: Normal adrenal glands. Bilateral too small
to characterize renal lesions. Renal sinus cysts, without
hydronephrosis.

Stomach/Bowel: Grossly normal abdominal bowel and stomach.

Vascular/Lymphatic: Advanced atherosclerosis within the aorta and
branch vessels. Patent portal vein and splenic vein. No vascular
encasement. No retroperitoneal or retrocrural adenopathy.

Other: No ascites.  No evidence of omental or peritoneal disease.

Musculoskeletal: Mild convex left lumbar spine curvature.
IMPRESSION: 1. Mildly motion degraded exam.
2. Moderate pancreatic duct dilatation with areas of side branch
duct ectasia. Although no dominant mass is seen, the pancreatic duct
dilatation continues to the level of the pancreatic head, where
there is mild vague hypo enhancement. Currently, there is no common
duct dilatation. Differential considerations include a subtle non
border deforming pancreatic adenocarcinoma, sequelae of pancreatitis
superimposed upon possible pancreas divisum and stricture near the
dorsal duct entrance into the duodenum, or a a mixed duct
intraductal papillary mucinous tumor. Consider ERCP with endoscopic
ultrasound.
3. No vascular involvement or evidence of metastatic disease within
the abdomen.

## 2016-09-01 DIAGNOSIS — D508 Other iron deficiency anemias: Secondary | ICD-10-CM | POA: Diagnosis not present

## 2016-09-01 DIAGNOSIS — I5022 Chronic systolic (congestive) heart failure: Secondary | ICD-10-CM | POA: Diagnosis not present

## 2016-09-01 DIAGNOSIS — Z0000 Encounter for general adult medical examination without abnormal findings: Secondary | ICD-10-CM | POA: Diagnosis not present

## 2016-09-01 DIAGNOSIS — I10 Essential (primary) hypertension: Secondary | ICD-10-CM | POA: Diagnosis not present

## 2016-09-01 DIAGNOSIS — E782 Mixed hyperlipidemia: Secondary | ICD-10-CM | POA: Diagnosis not present

## 2016-09-04 ENCOUNTER — Other Ambulatory Visit: Payer: Self-pay | Admitting: *Deleted

## 2016-09-07 ENCOUNTER — Encounter: Payer: Self-pay | Admitting: *Deleted

## 2016-09-07 NOTE — Patient Outreach (Signed)
[facility]) Care Management  09/04/2016  [name] 1931-07-11 [id number]  Subjective:RN Health Coach telephone call to patient. Hipaa compliance verified. Per patient he is doing pretty good today. Patient stated he is still going to his exercise classes. Per patient he feels mostly weak in his legs when exercising.Per patient his appetite is fair. He watches closely what he eats. He does not want to experience pancreatitis again. Patient is in the green zone. He does not have any swelling in his lower extremities.  Patient is checking his blood pressure reading at home and is having some concerns about knowing what is high and what is normal. Per patient her is weighing daily and very little variation in weight.  Patient has agreed to follow up out reach calls Objective:   Current Medications:  Current Outpatient Prescriptions  Medication Sig Dispense Refill  . Alpha-D-Galactosidase (BEANO) TABS Take 1 tablet by mouth.    Everlene Balls. ELIQUIS 5 MG TABS tablet Take 5 mg by mouth 2 (two) times daily.  11  . furosemide (LASIX) 40 MG tablet Take 1 tablet (40 mg total) by mouth daily. 30 tablet 5  . imipramine (TOFRANIL) 25 MG tablet Take 25 mg by mouth daily.    Marland Kitchen. levocetirizine (XYZAL) 5 MG tablet Take 5 mg by mouth every evening.    . loratadine (CLARITIN) 10 MG tablet Take 10 mg by mouth daily.    . metoprolol tartrate (LOPRESSOR) 25 MG tablet Take 1 tablet (25 mg total) by mouth 2 (two) times daily.    . Multiple Vitamin (MULTI-VITAMINS) TABS Take 1 tablet by mouth daily.    . nitroGLYCERIN (NITROSTAT) 0.4 MG SL tablet Place 0.4 mg under the tongue every 5 (five) minutes x 3 doses as needed. For chest pain. If no relief call MD or go to emergency room.    . vitamin E 400 UNIT capsule Take 400 Units by mouth daily.    Marland Kitchen. amiodarone (PACERONE) 400 MG tablet Take 400 mg by mouth daily.    Marland Kitchen. lisinopril (PRINIVIL,ZESTRIL) 5 MG tablet Take by mouth.     No current  facility-administered medications for this visit.     Functional Status:  In your present state of health, do you have any difficulty performing the following activities: 08/07/2016 07/10/2016  Hearing? N N  Vision? N N  Difficulty concentrating or making decisions? N N  Walking or climbing stairs? Y Y  Dressing or bathing? N N  Doing errands, shopping? N N  Preparing Food and eating ? - N  Using the Toilet? - N  In the past six months, have you accidently leaked urine? - N  Do you have problems with loss of bowel control? - -  Managing your Medications? - N  Managing your Finances? - N  Housekeeping or managing your Housekeeping? - N  Some recent data might be hidden    Fall/Depression Screening: PHQ 2/9 Scores 09/04/2016 08/07/2016 07/10/2016 06/12/2016 05/12/2016 04/10/2016 03/17/2016  PHQ - 2 Score 0 0 0 0 0 1 1   [facility] CM Care Plan Problem One   Flowsheet Row Most Recent Value  Care Plan Problem One  Knowledge deficit related to self management of congestive heart failure  Role Documenting the Problem One  Health Coach  Care Plan for Problem One  Active  [facility] CM Short Term Goal #3 (0-30 days)  Patient will continue to exercise on a regular basis to increase endurance  [facility] CM Short Term Goal #3 Start  Date  09/04/16  Interventions for Short Tern Goal #3  Discussed with patient tips on increasing his exercise. Discussed with patient about talking with his cardiologist again. Talked with patient about getting into a cardiac exercise program to slowly build  up his strength.. Did=scussed with patient about using leg machine to build up the strength in legs.   [facility] CM Short Term Goal #5 (0-30 days)  Patient will report Systolic below 140 within the next 30 days  [facility] CM Short Term Goal #5 Start Date  09/04/16  Interventions for Short Term Goal #5  RN discussed with patient what the blood pressure range is suppose to be. RN will send patient additional educational material on monitoring blood  pressure. RN will follow up with discussion and teaching.       Assessment:  Patient is in the green zone Patient does not understand blood pressure readings Patient has weakness in legs   Plan:  RN sent patient educational material on blood pressure monitoring RN discussed with patient about exercise program to strengthening his legs RN will follow up within the month of January  Gean MaidensFrances Henry Utsey BSN RN [facility] [phone]

## 2016-10-06 ENCOUNTER — Other Ambulatory Visit: Payer: Self-pay | Admitting: *Deleted

## 2016-10-06 DIAGNOSIS — I5022 Chronic systolic (congestive) heart failure: Secondary | ICD-10-CM

## 2016-10-06 DIAGNOSIS — I10 Essential (primary) hypertension: Secondary | ICD-10-CM

## 2016-10-06 NOTE — Patient Outreach (Signed)
[facility]) Care Management  10/06/2016  [name] 1931/01/08 [id number]   RN Health Coach attempted  #1 Follow up outreach call to patient.  Patient was unavailable. HIPPA compliance voicemail message was left with return callback number.  Plan: RN will call patient again within 14 days.     Gean MaidensFrances Dariya Gainer BSN RN [facility] [phone]

## 2016-10-06 NOTE — Patient Outreach (Signed)
[facility]) Care Management  10/06/2016   [name] 07-14-1931 [id number]  RN Health Coach received return telephone call from patient.  Hipaa compliance verified. Per patient he is doing good. Patient has no swelling in lower extremities. No shortness of breath with exercise.  Patient is going to the gym and trying to raise his endurance. Per patient with the leg exercises they feel weak afterwards. Discussed with patient about building his endurance. Per patient he was trying to do 75 pounds initially. Per patient he is having some problems with his blood pressure going up. Today his blood pressure was 153/ 78. Per patient he was told by someone it was the tofranil. Patient requested send to pharmacist for review. Patient is maintaining diet of low sodium and he is remaining off spicy foods and sugar due to history of pancreatitis. Patient has agreed to follow up outreach calls.    Current Medications:  Current Outpatient Prescriptions  Medication Sig Dispense Refill  . Alpha-D-Galactosidase (BEANO) TABS Take 1 tablet by mouth.    Marland Kitchen amiodarone (PACERONE) 400 MG tablet Take 400 mg by mouth daily.    Marland Kitchen ELIQUIS 5 MG TABS tablet Take 5 mg by mouth 2 (two) times daily.  11  . furosemide (LASIX) 40 MG tablet Take 1 tablet (40 mg total) by mouth daily. 30 tablet 5  . imipramine (TOFRANIL) 25 MG tablet Take 25 mg by mouth daily.    Marland Kitchen levocetirizine (XYZAL) 5 MG tablet Take 5 mg by mouth every evening.    Marland Kitchen lisinopril (PRINIVIL,ZESTRIL) 5 MG tablet Take by mouth.    . loratadine (CLARITIN) 10 MG tablet Take 10 mg by mouth daily.    . metoprolol tartrate (LOPRESSOR) 25 MG tablet Take 1 tablet (25 mg total) by mouth 2 (two) times daily.    . Multiple Vitamin (MULTI-VITAMINS) TABS Take 1 tablet by mouth daily.    . nitroGLYCERIN (NITROSTAT) 0.4 MG SL tablet Place 0.4 mg under the tongue every 5 (five) minutes x 3 doses as needed. For chest pain. If no relief call MD or go to  emergency room.    . vitamin E 400 UNIT capsule Take 400 Units by mouth daily.     No current facility-administered medications for this visit.     Functional Status:  In your present state of health, do you have any difficulty performing the following activities: 10/06/2016 08/07/2016  Hearing? N N  Vision? N N  Difficulty concentrating or making decisions? N N  Walking or climbing stairs? Y Y  Dressing or bathing? N N  Doing errands, shopping? N N  Preparing Food and eating ? N -  Using the Toilet? N -  In the past six months, have you accidently leaked urine? N -  Do you have problems with loss of bowel control? N -  Managing your Medications? N -  Managing your Finances? N -  Housekeeping or managing your Housekeeping? N -  Some recent data might be hidden    Fall/Depression Screening: PHQ 2/9 Scores 10/06/2016 09/04/2016 08/07/2016 07/10/2016 06/12/2016 05/12/2016 04/10/2016  PHQ - 2 Score 0 0 0 0 0 0 1    [facility] CM Care Plan Problem One   Flowsheet Row Most Recent Value  Care Plan Problem One  Knowledge deficit related to self management of congestive heart failure  Role Documenting the Problem One  Health Coach  Care Plan for Problem One  Active  [facility] Long Term Goal (31-90 days)  Patient  will be able to maintain blood pressure under 130/80 withinthe next 90 days  [facility] Long Term Goal Start Date  10/06/16  Interventions for Problem One Long Term Goal  RN discussed with patient about hypertension and planned goal range. Patient will adhere to taking medications as per ordered. Patient will follow up with Dr visits. Patient will adhere to low sodium diet.  Interventions for Short Tern Goal #3  Discussed with patient tips on increasing his exercise. Discussed with patient about talking with his cardiologist again. Talked with patient about getting into a cardiac exercise program to slowly build  up his strength.. Did=scussed with patient about using leg machine to build up the strength  in legs.        Plan:  RN referred to pharmacy RN discussed Healthy eating with teach back RN discussed hypertension and blood pressure readings RN will follow up within the month of February  Gean MaidensFrances Warrick Llera BSN RN [facility] Care Management [phone]

## 2016-10-13 ENCOUNTER — Other Ambulatory Visit: Payer: Self-pay | Admitting: Pharmacist

## 2016-10-13 NOTE — Patient Outreach (Signed)
[name] was referred to pharmacy for medication management related to a question about his imipramine. Called and spoke with patient. HIPAA identifiers verified and verbal consent received.  [name] reports that ever since he started taking his imipramine, he has noticed a slight increase in his blood pressure. Reports the he was prescribed the imipramine by Dr. Judithann SheenSparks to manage his ongoing stomach issues. Reports that the imipramine has helped to control his symptoms, but that he has noticed this effect on his blood pressure. Reports that he checks his blood pressure at home and keeps a log. Patient reports that he most recently checked his blood pressure on Sunday and it was 140/81 with a pulse of 66. Patient reports that he has an upcoming appointment with his Cardiologist on 10/23/16, at which time he plans to discuss this issue. Advise patient to continue to monitor his blood pressure and to also place a call to his PCP to let Dr. Judithann SheenSparks know about this potential side effect. Patient verbalizes understanding.  Patient reports that he has no further medication questions/concerns at this time. Provide patient with my phone number.  Will close pharmacy episode at this time.  Duanne MoronElisabeth Priscilla Kirstein, PharmD, [facility] CenterBCACP Clinical Pharmacist [facility] Care Management [phone]

## 2016-10-19 ENCOUNTER — Ambulatory Visit: Payer: Self-pay | Admitting: *Deleted

## 2016-10-23 DIAGNOSIS — I10 Essential (primary) hypertension: Secondary | ICD-10-CM | POA: Diagnosis not present

## 2016-10-23 DIAGNOSIS — I5022 Chronic systolic (congestive) heart failure: Secondary | ICD-10-CM | POA: Diagnosis not present

## 2016-10-23 DIAGNOSIS — I480 Paroxysmal atrial fibrillation: Secondary | ICD-10-CM | POA: Diagnosis not present

## 2016-10-23 DIAGNOSIS — I2510 Atherosclerotic heart disease of native coronary artery without angina pectoris: Secondary | ICD-10-CM | POA: Diagnosis not present

## 2016-11-08 ENCOUNTER — Other Ambulatory Visit: Payer: Self-pay | Admitting: *Deleted

## 2016-11-13 DIAGNOSIS — M79674 Pain in right toe(s): Secondary | ICD-10-CM | POA: Diagnosis not present

## 2016-11-13 DIAGNOSIS — M2041 Other hammer toe(s) (acquired), right foot: Secondary | ICD-10-CM | POA: Diagnosis not present

## 2016-11-13 NOTE — Patient Outreach (Signed)
[facility]) Care Management  11/13/2016 Late entry  EDEN RHO Sep 19, 1930 [id number]   RN Health Coach telephone call to patient.  Hipaa compliance verified. Per patient he is feeling pretty good. He is not having any swelling in his lower extremities or excessive shortness of breath. Patient is still going to the gym daily and exercising. His tolerance had not built up much. Patient his blood pressure is still going up. Patient stated he takes it after he gets back home from exercising. Patient is maintaining  weight at 161-162 pounds. Patient has agreed to follow up outreach calls.    Current Medications:  Current Outpatient Prescriptions  Medication Sig Dispense Refill  . Alpha-D-Galactosidase (BEANO) TABS Take 1 tablet by mouth.    Marland Kitchen amiodarone (PACERONE) 400 MG tablet Take 400 mg by mouth daily.    Marland Kitchen ELIQUIS 5 MG TABS tablet Take 5 mg by mouth 2 (two) times daily.  11  . furosemide (LASIX) 40 MG tablet Take 1 tablet (40 mg total) by mouth daily. 30 tablet 5  . imipramine (TOFRANIL) 25 MG tablet Take 25 mg by mouth daily.    Marland Kitchen levocetirizine (XYZAL) 5 MG tablet Take 5 mg by mouth every evening.    Marland Kitchen lisinopril (PRINIVIL,ZESTRIL) 5 MG tablet Take by mouth.    . loratadine (CLARITIN) 10 MG tablet Take 10 mg by mouth daily.    . metoprolol tartrate (LOPRESSOR) 25 MG tablet Take 1 tablet (25 mg total) by mouth 2 (two) times daily.    . Multiple Vitamin (MULTI-VITAMINS) TABS Take 1 tablet by mouth daily.    . nitroGLYCERIN (NITROSTAT) 0.4 MG SL tablet Place 0.4 mg under the tongue every 5 (five) minutes x 3 doses as needed. For chest pain. If no relief call MD or go to emergency room.    . vitamin E 400 UNIT capsule Take 400 Units by mouth daily.     No current facility-administered medications for this visit.     Functional Status:  In your present state of health, do you have any difficulty performing the following activities: 11/08/2016 10/06/2016  Hearing? N  N  Vision? N N  Difficulty concentrating or making decisions? N N  Walking or climbing stairs? Y Y  Dressing or bathing? N N  Doing errands, shopping? N N  Preparing Food and eating ? - N  Using the Toilet? - N  In the past six months, have you accidently leaked urine? - N  Do you have problems with loss of bowel control? - N  Managing your Medications? - N  Managing your Finances? - N  Housekeeping or managing your Housekeeping? - N  Some recent data might be hidden    Fall/Depression Screening: PHQ 2/9 Scores 11/08/2016 10/06/2016 09/04/2016 08/07/2016 07/10/2016 06/12/2016 05/12/2016  PHQ - 2 Score 0 0 0 0 0 0 0   [facility] CM Care Plan Problem One   Flowsheet Row Most Recent Value  Care Plan Problem One  (P) Knowledge deficit related to self management of congestive heart failure  Role Documenting the Problem One  (P) Health Coach  Care Plan for Problem One  (P) Active  [facility]N Long Term Goal (31-90 days)  (P) Patient will be able to maintain blood pressure under 130/80 withinthe next 90 days  [facility]N Long Term Goal Start Date  (P) 11/08/16  Interventions for Problem One Long Term Goal  (P) RN discussed with patient about hypertension and planned goal range. Patient will adhere to taking  medications as per ordered. Patient will follow up with Dr visits. Patient will adhere to low sodium diet.. RN told patient to take before exercising and see what it is. Patient will document and give reading next outreach call.   [facility] CM Short Term Goal #3 (0-30 days)  (P) Patient will continue to exercise on a regular basis to increase endurance  [facility] CM Short Term Goal #3 Start Date  (P) 11/08/16      Assessment:  Patient will continue to benefit from Health Coach telephonic outreach for education and support for congestive heart failure self management.  Plan:  Patient will take blood pressures in the morning instead of after exercising RN discussed daily weights RN will follow up within the month of  March  Gean MaidensFrances Umaiza Matusik BSN RN [facility] Care Management [phone]

## 2016-12-01 DIAGNOSIS — Z1329 Encounter for screening for other suspected endocrine disorder: Secondary | ICD-10-CM | POA: Diagnosis not present

## 2016-12-01 DIAGNOSIS — E538 Deficiency of other specified B group vitamins: Secondary | ICD-10-CM | POA: Diagnosis not present

## 2016-12-01 DIAGNOSIS — I10 Essential (primary) hypertension: Secondary | ICD-10-CM | POA: Diagnosis not present

## 2016-12-01 DIAGNOSIS — Z0000 Encounter for general adult medical examination without abnormal findings: Secondary | ICD-10-CM | POA: Diagnosis not present

## 2016-12-01 DIAGNOSIS — E782 Mixed hyperlipidemia: Secondary | ICD-10-CM | POA: Diagnosis not present

## 2016-12-01 DIAGNOSIS — I483 Typical atrial flutter: Secondary | ICD-10-CM | POA: Diagnosis not present

## 2016-12-01 DIAGNOSIS — R001 Bradycardia, unspecified: Secondary | ICD-10-CM | POA: Diagnosis not present

## 2016-12-01 DIAGNOSIS — Z79899 Other long term (current) drug therapy: Secondary | ICD-10-CM | POA: Diagnosis not present

## 2016-12-01 DIAGNOSIS — I5022 Chronic systolic (congestive) heart failure: Secondary | ICD-10-CM | POA: Diagnosis not present

## 2016-12-06 ENCOUNTER — Ambulatory Visit: Payer: Self-pay | Admitting: *Deleted

## 2016-12-11 DIAGNOSIS — M79674 Pain in right toe(s): Secondary | ICD-10-CM | POA: Diagnosis not present

## 2016-12-11 DIAGNOSIS — M2041 Other hammer toe(s) (acquired), right foot: Secondary | ICD-10-CM | POA: Diagnosis not present

## 2016-12-12 ENCOUNTER — Other Ambulatory Visit: Payer: Self-pay | Admitting: *Deleted

## 2016-12-12 NOTE — Patient Outreach (Signed)
[facility]) Care Management  12/12/2016   ZAKHARI FOGEL 11/07/1930 [id number]  RN Health Coach telephone call to patient.  Hipaa compliance verified. Per patient he is still having a lot of fatigue. RN discussed with patient what activities he is doing after he goes to the gym. Patient is very sedentary after going to the gym. He does not walk additional except within the house. He mows the grass on the riding lawnmower. RN discussed with patient about taking walks around the property to increase his stamina.  Patient stated he is still having some problem with his bowels being loose and mucus stools. Patient breakfast consisted of oatmeal and raisins. RN discussed with patient about the diet being high in fiber that prevents constipation and make stools softer. Patient weight today is 161. Patient still has shortness of breath on exertion. No swelling in lower extremities or abdomen. Patient has agreed to outreach calls and looks forward to them.    Current Medications:  Current Outpatient Prescriptions  Medication Sig Dispense Refill  . Alpha-D-Galactosidase (BEANO) TABS Take 1 tablet by mouth.    Marland Kitchen amiodarone (PACERONE) 400 MG tablet Take 400 mg by mouth daily.    Marland Kitchen ELIQUIS 5 MG TABS tablet Take 5 mg by mouth 2 (two) times daily.  11  . furosemide (LASIX) 40 MG tablet Take 1 tablet (40 mg total) by mouth daily. 30 tablet 5  . imipramine (TOFRANIL) 25 MG tablet Take 25 mg by mouth daily.    Marland Kitchen levocetirizine (XYZAL) 5 MG tablet Take 5 mg by mouth every evening.    Marland Kitchen lisinopril (PRINIVIL,ZESTRIL) 5 MG tablet Take by mouth.    . loratadine (CLARITIN) 10 MG tablet Take 10 mg by mouth daily.    . metoprolol tartrate (LOPRESSOR) 25 MG tablet Take 1 tablet (25 mg total) by mouth 2 (two) times daily.    . Multiple Vitamin (MULTI-VITAMINS) TABS Take 1 tablet by mouth daily.    . nitroGLYCERIN (NITROSTAT) 0.4 MG SL tablet Place 0.4 mg under the tongue every 5 (five) minutes x 3  doses as needed. For chest pain. If no relief call MD or go to emergency room.    . vitamin E 400 UNIT capsule Take 400 Units by mouth daily.     No current facility-administered medications for this visit.     Functional Status:  In your present state of health, do you have any difficulty performing the following activities: 12/12/2016 11/08/2016  Hearing? N N  Vision? N N  Difficulty concentrating or making decisions? N N  Walking or climbing stairs? Y Y  Dressing or bathing? N N  Doing errands, shopping? N N  Preparing Food and eating ? N -  Using the Toilet? N -  In the past six months, have you accidently leaked urine? N -  Do you have problems with loss of bowel control? N -  Managing your Medications? N -  Managing your Finances? N -  Housekeeping or managing your Housekeeping? N -  Some recent data might be hidden    Fall/Depression Screening: PHQ 2/9 Scores 12/12/2016 11/08/2016 10/06/2016 09/04/2016 08/07/2016 07/10/2016 06/12/2016  PHQ - 2 Score 0 0 0 0 0 0 0   [facility] CM Care Plan Problem One     Most Recent Value  Care Plan Problem One  Knowledge deficit related to self management of congestive heart failure  Role Documenting the Problem One  [place] for Problem One  Active  [facility]N Long Term Goal (31-90 days)  Patient will be able to maintain blood pressure under 130/80 withinthe next 90 days  Interventions for Problem One Long Term Goal  RN discussed with patient about hypertension and planned goal range. Patient will adhere to taking medications as per ordered. Patient will follow up with Dr visits. Patient will adhere to low sodium diet.  [facility] CM Short Term Goal #2 (0-30 days)  Patient will report dietary changes within the next 30 days to decrease loose bowel movements  [facility] CM Short Term Goal #2 Start Date  12/12/16  Interventions for Short Term Goal #2  RN discussed the importance of changing diet to include no fried foods,raisins, prunes, and seeds. RN will  send educational material on food lower in fiber to help decrease frequency. RN will follow up with outreach call.   [facility] CM Short Term Goal #3 Start Date  12/12/16  Interventions for Short Tern Goal #3  Discussed with patient tips on increasing his exercise. Discussed with patient about talking with his cardiologist again. Talked with patient about getting into a cardiac exercise program to slowly build  up his strength.. Did=scussed with patient about using leg machine to build up the strength in legs.   [facility] CM Short Term Goal #4 (0-30 days)  Patient will be able to verbalize what his blood pressure readings mean within the next 30 days  [facility] CM Short Term Goal #5 (0-30 days)  Patient will report Systolic below 161 within the next 30 days  [facility] CM Short Term Goal #5 Start Date  12/12/16  [facility] CM Short Term Goal #5 Met Date  12/12/16  Interventions for Short Term Goal #5  RN discussed with patient what the blood pressure range is suppose to be. RN will send patient additional educational material on monitoring blood pressure. RN will follow up with discussion and teaching.        Assessment:  Patient is very sedentary Patient is still having loose stols Patient will continue to benefit from [place] telephonic outreach for education and support for congestive heart failure self management.  Plan:  RN sent educational material on low fiber diet RN discussed patient increasing activity  RN sent nutritional supplement coupons RN will follow up outreach  within the month of April  Ludwig Tugwell [place] Management [phone]

## 2016-12-13 ENCOUNTER — Encounter: Payer: Self-pay | Admitting: *Deleted

## 2017-01-08 DIAGNOSIS — I2510 Atherosclerotic heart disease of native coronary artery without angina pectoris: Secondary | ICD-10-CM | POA: Diagnosis not present

## 2017-01-08 DIAGNOSIS — I10 Essential (primary) hypertension: Secondary | ICD-10-CM | POA: Diagnosis not present

## 2017-01-08 DIAGNOSIS — R0602 Shortness of breath: Secondary | ICD-10-CM | POA: Diagnosis not present

## 2017-01-08 DIAGNOSIS — I5023 Acute on chronic systolic (congestive) heart failure: Secondary | ICD-10-CM | POA: Diagnosis not present

## 2017-01-12 ENCOUNTER — Other Ambulatory Visit: Payer: Self-pay | Admitting: *Deleted

## 2017-01-12 NOTE — Patient Outreach (Signed)
[facility]) Care Management  01/12/2017   [name] 1930/10/22 [id number]  RN Health Coach telephone call to patient.  Hipaa compliance verified. Per patient Patient stated his appetite is not very good. Patient weight has decreased from 160 to 158. Per patient he goes to the gym and exercises but his legs feel weak. Per patient he is able to tolerate only about 20 minutes in the gym.Patient stated he is going for stress test and evaluation next week. Patient states he is in the green zone. He is not having any swelling in lower extremities or abdomen. Patient does not have any coughing episodes with shortness of breath.  Patient has agreed to follow up outreach calls.    Current Medications:  Current Outpatient Prescriptions  Medication Sig Dispense Refill  . Alpha-D-Galactosidase (BEANO) TABS Take 1 tablet by mouth.    Marland Kitchen amiodarone (PACERONE) 400 MG tablet Take 400 mg by mouth daily.    Marland Kitchen ELIQUIS 5 MG TABS tablet Take 5 mg by mouth 2 (two) times daily.  11  . furosemide (LASIX) 40 MG tablet Take 1 tablet (40 mg total) by mouth daily. 30 tablet 5  . imipramine (TOFRANIL) 25 MG tablet Take 25 mg by mouth daily.    Marland Kitchen levocetirizine (XYZAL) 5 MG tablet Take 5 mg by mouth every evening.    Marland Kitchen lisinopril (PRINIVIL,ZESTRIL) 5 MG tablet Take by mouth.    . loratadine (CLARITIN) 10 MG tablet Take 10 mg by mouth daily.    . metoprolol tartrate (LOPRESSOR) 25 MG tablet Take 1 tablet (25 mg total) by mouth 2 (two) times daily.    . Multiple Vitamin (MULTI-VITAMINS) TABS Take 1 tablet by mouth daily.    . nitroGLYCERIN (NITROSTAT) 0.4 MG SL tablet Place 0.4 mg under the tongue every 5 (five) minutes x 3 doses as needed. For chest pain. If no relief call MD or go to emergency room.    . vitamin E 400 UNIT capsule Take 400 Units by mouth daily.     No current facility-administered medications for this visit.     Functional Status:  In your present state of health, do you  have any difficulty performing the following activities: 12/12/2016 11/08/2016  Hearing? N N  Vision? N N  Difficulty concentrating or making decisions? N N  Walking or climbing stairs? Y Y  Dressing or bathing? N N  Doing errands, shopping? N N  Preparing Food and eating ? N -  Using the Toilet? N -  In the past six months, have you accidently leaked urine? N -  Do you have problems with loss of bowel control? N -  Managing your Medications? N -  Managing your Finances? N -  Housekeeping or managing your Housekeeping? N -  Some recent data might be hidden    Fall/Depression Screening: Fall Risk  12/12/2016 11/08/2016 10/06/2016  Falls in the past year? Yes Yes Yes  Number falls in past yr: Injury with Fall? No No No  Risk Factor Category  High Fall Risk High Fall Risk High Fall Risk  Risk for fall due to : History of fall(s);Impaired balance/gait;Impaired mobility Impaired balance/gait;History of fall(s);Impaired mobility Impaired balance/gait;Impaired mobility;History of fall(s)  Follow up Falls evaluation completed;Education provided Falls evaluation completed;Education provided Education provided;Falls evaluation completed   PHQ 2/9 Scores 12/12/2016 11/08/2016 10/06/2016 09/04/2016 08/07/2016 07/10/2016 06/12/2016  PHQ - 2 Score 0 0 0 0 0 0 0   [facility] CM Care Plan  Problem One     Most Recent Value  Care Plan Problem One  Knowledge deficit related to self management of congestive heart failure  Role Documenting the Problem One  Health Coach  Care Plan for Problem One  Active  [facility] CM Short Term Goal #3 (0-30 days)  Patient will continue to exercise on a regular basis to increase endurance  [facility] CM Short Term Goal #3 Start Date  01/12/17  Interventions for Short Tern Goal #3  Discussed with patient tips on increasing his exercise. Discussed with patient about talking with his cardiologist again. Talked with patient about getting into a cardiac exercise program to slowly build  up his  strength.. Discussed with patient about using leg machine to build up the strength in legs.       Assessment: Patient appetite is poor Patient is loosing weight Patient has shortness of breath with exertion Patient will continue to benefit from [facility] Coach telephonic outreach for education and support for Congestive Heart Failure self management.   Plan:  RN will send patient nutritional coupons Patient will be following up with physician for cardiac workup RN will follow up outreach with the month of May.   [name] BSN RN [facility] Care Management [phone]

## 2017-01-16 ENCOUNTER — Encounter: Payer: Self-pay | Admitting: *Deleted

## 2017-01-24 ENCOUNTER — Other Ambulatory Visit: Payer: Self-pay | Admitting: *Deleted

## 2017-01-24 NOTE — Patient Outreach (Signed)
[facility]) Care Management  01/24/2017  [name] 07-22-1931 [id number]    RN Health Coach received telephone call from patient.  Hipaa compliance verified. Per patient he has received the nutritional coupons that was sent. Per patient he has some concerns about his poor exercise tolerance and fatigue. Per patient he has asked his cardiologist for a full work up. Patient wanted RNHealth Coach to know of upcoming tests.  Gean MaidensFrances Reeda Soohoo BSN RN [facility] Care Management [phone]

## 2017-01-30 ENCOUNTER — Other Ambulatory Visit: Payer: Self-pay | Admitting: *Deleted

## 2017-01-30 NOTE — Patient Outreach (Signed)
[facility]) Care Management  01/30/2017  Maryruth BunCarl L Binz 15-Jan-1931 [id number]   RN Health Coach received  telephone call from patient.  Hipaa compliance verified. Per patient he has lost 7 pounds in two weeks. Per patient his stools have changed to gray color. Patient stated he is not take any peptobismol but he does eat Tums every day. Per patient he is not having any abdominal pain. His appetite is very good and he is drinking supplemental ensure mixed with ice cream in between.   Plan: Patient will discuss with doctor on Thursday morning   Gean MaidensFrances [place] BSN RN [facility] Care Management [phone]

## 2017-02-01 DIAGNOSIS — I5023 Acute on chronic systolic (congestive) heart failure: Secondary | ICD-10-CM | POA: Diagnosis not present

## 2017-02-05 DIAGNOSIS — I469 Cardiac arrest, cause unspecified: Secondary | ICD-10-CM | POA: Diagnosis not present

## 2017-02-07 ENCOUNTER — Encounter: Payer: Self-pay | Admitting: *Deleted

## 2017-02-13 ENCOUNTER — Ambulatory Visit: Payer: Self-pay | Admitting: *Deleted

## 2017-02-16 DIAGNOSIS — 419620001 Death: Secondary | SNOMED CT | POA: Diagnosis not present

## 2017-02-16 DEATH — deceased

## 2017-04-03 ENCOUNTER — Encounter: Payer: Self-pay | Admitting: *Deleted
